# Patient Record
Sex: Female | Born: 1935 | Race: White | Hispanic: No | Smoking: Never smoker
Health system: Southern US, Community
[De-identification: ages and names within clinical notes are randomized; demographics above are authoritative.]

## PROBLEM LIST (undated history)

## (undated) HISTORY — PX: APPENDECTOMY: SHX54

## (undated) HISTORY — PX: TONSILLECTOMY: SUR1361

---

## 2014-08-28 HISTORY — PX: AORTIC VALVE REPLACEMENT: SHX41

## 2015-08-07 ENCOUNTER — Emergency Department (HOSPITAL_COMMUNITY): Payer: Medicare Other

## 2015-08-07 ENCOUNTER — Encounter (HOSPITAL_COMMUNITY): Payer: Self-pay | Admitting: Emergency Medicine

## 2015-08-07 ENCOUNTER — Inpatient Hospital Stay (HOSPITAL_COMMUNITY)
Admission: EM | Admit: 2015-08-07 | Discharge: 2015-08-11 | DRG: 378 | Disposition: A | Discharge status: Home / Self Care | Payer: Medicare Other | Attending: Internal Medicine | Admitting: Internal Medicine

## 2015-08-07 DIAGNOSIS — Z9109 Other allergy status, other than to drugs and biological substances: Secondary | ICD-10-CM

## 2015-08-07 DIAGNOSIS — I4891 Unspecified atrial fibrillation: Secondary | ICD-10-CM | POA: Diagnosis present

## 2015-08-07 DIAGNOSIS — D62 Acute posthemorrhagic anemia: Secondary | ICD-10-CM | POA: Diagnosis not present

## 2015-08-07 DIAGNOSIS — G2581 Restless legs syndrome: Secondary | ICD-10-CM | POA: Diagnosis present

## 2015-08-07 DIAGNOSIS — Z888 Allergy status to other drugs, medicaments and biological substances status: Secondary | ICD-10-CM | POA: Diagnosis not present

## 2015-08-07 DIAGNOSIS — K5521 Angiodysplasia of colon with hemorrhage: Secondary | ICD-10-CM | POA: Diagnosis present

## 2015-08-07 DIAGNOSIS — Z8249 Family history of ischemic heart disease and other diseases of the circulatory system: Secondary | ICD-10-CM | POA: Diagnosis not present

## 2015-08-07 DIAGNOSIS — T501X5A Adverse effect of loop [high-ceiling] diuretics, initial encounter: Secondary | ICD-10-CM | POA: Diagnosis present

## 2015-08-07 DIAGNOSIS — R0601 Orthopnea: Secondary | ICD-10-CM | POA: Diagnosis present

## 2015-08-07 DIAGNOSIS — Z86010 Personal history of colonic polyps: Secondary | ICD-10-CM | POA: Diagnosis not present

## 2015-08-07 DIAGNOSIS — I480 Paroxysmal atrial fibrillation: Secondary | ICD-10-CM | POA: Diagnosis present

## 2015-08-07 DIAGNOSIS — E038 Other specified hypothyroidism: Secondary | ICD-10-CM | POA: Diagnosis present

## 2015-08-07 DIAGNOSIS — R0602 Shortness of breath: Secondary | ICD-10-CM | POA: Diagnosis present

## 2015-08-07 DIAGNOSIS — E1169 Type 2 diabetes mellitus with other specified complication: Secondary | ICD-10-CM | POA: Diagnosis present

## 2015-08-07 DIAGNOSIS — E119 Type 2 diabetes mellitus without complications: Secondary | ICD-10-CM | POA: Diagnosis not present

## 2015-08-07 DIAGNOSIS — N179 Acute kidney failure, unspecified: Secondary | ICD-10-CM | POA: Diagnosis present

## 2015-08-07 DIAGNOSIS — I130 Hypertensive heart and chronic kidney disease with heart failure and stage 1 through stage 4 chronic kidney disease, or unspecified chronic kidney disease: Secondary | ICD-10-CM | POA: Diagnosis present

## 2015-08-07 DIAGNOSIS — I10 Essential (primary) hypertension: Secondary | ICD-10-CM | POA: Diagnosis not present

## 2015-08-07 DIAGNOSIS — Z7901 Long term (current) use of anticoagulants: Secondary | ICD-10-CM | POA: Diagnosis not present

## 2015-08-07 DIAGNOSIS — Z952 Presence of prosthetic heart valve: Secondary | ICD-10-CM

## 2015-08-07 DIAGNOSIS — K921 Melena: Secondary | ICD-10-CM | POA: Insufficient documentation

## 2015-08-07 DIAGNOSIS — I5032 Chronic diastolic (congestive) heart failure: Secondary | ICD-10-CM | POA: Diagnosis present

## 2015-08-07 DIAGNOSIS — D6489 Other specified anemias: Secondary | ICD-10-CM

## 2015-08-07 DIAGNOSIS — Z953 Presence of xenogenic heart valve: Secondary | ICD-10-CM

## 2015-08-07 DIAGNOSIS — K5900 Constipation, unspecified: Secondary | ICD-10-CM | POA: Diagnosis present

## 2015-08-07 DIAGNOSIS — N189 Chronic kidney disease, unspecified: Secondary | ICD-10-CM | POA: Diagnosis present

## 2015-08-07 DIAGNOSIS — K922 Gastrointestinal hemorrhage, unspecified: Secondary | ICD-10-CM | POA: Diagnosis present

## 2015-08-07 DIAGNOSIS — E785 Hyperlipidemia, unspecified: Secondary | ICD-10-CM | POA: Diagnosis present

## 2015-08-07 DIAGNOSIS — I482 Chronic atrial fibrillation: Secondary | ICD-10-CM | POA: Diagnosis not present

## 2015-08-07 DIAGNOSIS — K648 Other hemorrhoids: Secondary | ICD-10-CM | POA: Diagnosis present

## 2015-08-07 DIAGNOSIS — Z7982 Long term (current) use of aspirin: Secondary | ICD-10-CM

## 2015-08-07 DIAGNOSIS — E089 Diabetes mellitus due to underlying condition without complications: Secondary | ICD-10-CM | POA: Diagnosis not present

## 2015-08-07 DIAGNOSIS — D72829 Elevated white blood cell count, unspecified: Secondary | ICD-10-CM | POA: Diagnosis present

## 2015-08-07 DIAGNOSIS — R195 Other fecal abnormalities: Secondary | ICD-10-CM | POA: Diagnosis not present

## 2015-08-07 DIAGNOSIS — Z794 Long term (current) use of insulin: Secondary | ICD-10-CM | POA: Diagnosis not present

## 2015-08-07 DIAGNOSIS — M353 Polymyalgia rheumatica: Secondary | ICD-10-CM | POA: Diagnosis present

## 2015-08-07 DIAGNOSIS — Z6841 Body Mass Index (BMI) 40.0 and over, adult: Secondary | ICD-10-CM

## 2015-08-07 DIAGNOSIS — Z7952 Long term (current) use of systemic steroids: Secondary | ICD-10-CM | POA: Diagnosis not present

## 2015-08-07 DIAGNOSIS — E1122 Type 2 diabetes mellitus with diabetic chronic kidney disease: Secondary | ICD-10-CM | POA: Diagnosis present

## 2015-08-07 DIAGNOSIS — E039 Hypothyroidism, unspecified: Secondary | ICD-10-CM | POA: Diagnosis present

## 2015-08-07 DIAGNOSIS — N182 Chronic kidney disease, stage 2 (mild): Secondary | ICD-10-CM | POA: Insufficient documentation

## 2015-08-07 LAB — CBC WITH DIFFERENTIAL/PLATELET
BASOS ABS: 0 10*3/uL (ref 0.0–0.1)
Basophils Relative: 0 %
Eosinophils Absolute: 0 10*3/uL (ref 0.0–0.7)
Eosinophils Relative: 0 %
HEMATOCRIT: 14.7 % — AB (ref 36.0–46.0)
Hemoglobin: 4.7 g/dL — CL (ref 12.0–15.0)
LYMPHS ABS: 1.2 10*3/uL (ref 0.7–4.0)
Lymphocytes Relative: 11 %
MCH: 29.6 pg (ref 26.0–34.0)
MCHC: 32 g/dL (ref 30.0–36.0)
MCV: 92.5 fL (ref 78.0–100.0)
MONOS PCT: 4 %
Monocytes Absolute: 0.4 10*3/uL (ref 0.1–1.0)
NEUTROS ABS: 9.6 10*3/uL — AB (ref 1.7–7.7)
Neutrophils Relative %: 85 %
Platelets: 162 10*3/uL (ref 150–400)
RBC: 1.59 MIL/uL — ABNORMAL LOW (ref 3.87–5.11)
RDW: 19.3 % — AB (ref 11.5–15.5)
WBC: 11.2 10*3/uL — ABNORMAL HIGH (ref 4.0–10.5)

## 2015-08-07 LAB — COMPREHENSIVE METABOLIC PANEL
ALBUMIN: 3.3 g/dL — AB (ref 3.5–5.0)
ALT: 21 U/L (ref 14–54)
ANION GAP: 10 (ref 5–15)
AST: 28 U/L (ref 15–41)
Alkaline Phosphatase: 39 U/L (ref 38–126)
BILIRUBIN TOTAL: 0.8 mg/dL (ref 0.3–1.2)
BUN: 52 mg/dL — ABNORMAL HIGH (ref 6–20)
CO2: 22 mmol/L (ref 22–32)
Calcium: 8.6 mg/dL — ABNORMAL LOW (ref 8.9–10.3)
Chloride: 105 mmol/L (ref 101–111)
Creatinine, Ser: 1.7 mg/dL — ABNORMAL HIGH (ref 0.44–1.00)
GFR calc Af Amer: 31 mL/min — ABNORMAL LOW (ref >60)
GFR, EST NON AFRICAN AMERICAN: 27 mL/min — AB (ref >60)
Glucose, Bld: 185 mg/dL — ABNORMAL HIGH (ref 65–99)
POTASSIUM: 4.5 mmol/L (ref 3.5–5.1)
Sodium: 137 mmol/L (ref 135–145)
TOTAL PROTEIN: 5.4 g/dL — AB (ref 6.5–8.1)

## 2015-08-07 LAB — BRAIN NATRIURETIC PEPTIDE: B NATRIURETIC PEPTIDE 5: 305.1 pg/mL — AB (ref 0.0–100.0)

## 2015-08-07 LAB — PREPARE RBC (CROSSMATCH)

## 2015-08-07 LAB — I-STAT TROPONIN, ED: TROPONIN I, POC: 0.05 ng/mL (ref 0.00–0.08)

## 2015-08-07 LAB — POC OCCULT BLOOD, ED: Fecal Occult Bld: POSITIVE — AB

## 2015-08-07 LAB — CBG MONITORING, ED: GLUCOSE-CAPILLARY: 152 mg/dL — AB (ref 65–99)

## 2015-08-07 MED ORDER — ONDANSETRON HCL 4 MG/2ML IJ SOLN: 4.0000 mg | Freq: Four times a day (QID) | INTRAMUSCULAR | Status: DC | PRN

## 2015-08-07 MED ORDER — ROPINIROLE HCL 1 MG PO TABS
1.0000 mg | ORAL_TABLET | Freq: Every day | ORAL | Status: DC
  Administered 2015-08-07 – 2015-08-11 (×5): 1 mg via ORAL
  Filled 2015-08-07 (×5): qty 1

## 2015-08-07 MED ORDER — PREDNISONE 5 MG PO TABS
5.0000 mg | ORAL_TABLET | Freq: Every day | ORAL | Status: DC
  Administered 2015-08-08 – 2015-08-11 (×4): 5 mg via ORAL
  Filled 2015-08-07 (×4): qty 1

## 2015-08-07 MED ORDER — CALCIUM CARBONATE-VITAMIN D 500-200 MG-UNIT PO TABS
1.0000 | ORAL_TABLET | Freq: Every day | ORAL | Status: DC
  Administered 2015-08-07 – 2015-08-11 (×4): 1 via ORAL
  Filled 2015-08-07 (×6): qty 1

## 2015-08-07 MED ORDER — FLUTICASONE PROPIONATE 50 MCG/ACT NA SUSP
1.0000 | Freq: Every day | NASAL | Status: DC
  Administered 2015-08-09 – 2015-08-11 (×3): 1 via NASAL
  Filled 2015-08-07: qty 16

## 2015-08-07 MED ORDER — SODIUM CHLORIDE 0.9 % IV SOLN
Freq: Once | INTRAVENOUS | Status: AC
  Administered 2015-08-07: 18:00:00 via INTRAVENOUS

## 2015-08-07 MED ORDER — ACETAMINOPHEN 325 MG PO TABS: 650.0000 mg | ORAL_TABLET | Freq: Four times a day (QID) | ORAL | Status: DC | PRN

## 2015-08-07 MED ORDER — ONDANSETRON HCL 4 MG PO TABS: 4.0000 mg | ORAL_TABLET | Freq: Four times a day (QID) | ORAL | Status: DC | PRN

## 2015-08-07 MED ORDER — AMLODIPINE BESYLATE 2.5 MG PO TABS
2.5000 mg | ORAL_TABLET | Freq: Every day | ORAL | Status: DC
  Administered 2015-08-08 – 2015-08-11 (×4): 2.5 mg via ORAL
  Filled 2015-08-07 (×4): qty 1

## 2015-08-07 MED ORDER — SODIUM CHLORIDE 0.9 % IJ SOLN
3.0000 mL | Freq: Two times a day (BID) | INTRAMUSCULAR | Status: DC
  Administered 2015-08-10 – 2015-08-11 (×2): 3 mL via INTRAVENOUS

## 2015-08-07 MED ORDER — ATORVASTATIN CALCIUM 80 MG PO TABS
80.0000 mg | ORAL_TABLET | Freq: Every day | ORAL | Status: DC
Start: 2015-08-08 — End: 2015-08-11
  Administered 2015-08-08 – 2015-08-11 (×4): 80 mg via ORAL
  Filled 2015-08-07 (×4): qty 1

## 2015-08-07 MED ORDER — VITAMIN D3 25 MCG (1000 UNIT) PO TABS
2000.0000 [IU] | ORAL_TABLET | Freq: Every day | ORAL | Status: DC
  Administered 2015-08-07 – 2015-08-11 (×5): 2000 [IU] via ORAL
  Filled 2015-08-07 (×5): qty 2

## 2015-08-07 MED ORDER — GLIMEPIRIDE 1 MG PO TABS
1.0000 mg | ORAL_TABLET | Freq: Every day | ORAL | Status: DC
  Administered 2015-08-08 – 2015-08-11 (×2): 1 mg via ORAL
  Filled 2015-08-07 (×4): qty 1

## 2015-08-07 MED ORDER — LEVOTHYROXINE SODIUM 50 MCG PO TABS
50.0000 ug | ORAL_TABLET | Freq: Every day | ORAL | Status: DC
  Administered 2015-08-08 – 2015-08-11 (×4): 50 ug via ORAL
  Filled 2015-08-07 (×4): qty 1

## 2015-08-07 MED ORDER — ACETAMINOPHEN 650 MG RE SUPP: 650.0000 mg | Freq: Four times a day (QID) | RECTAL | Status: DC | PRN

## 2015-08-07 MED ORDER — AMIODARONE HCL 200 MG PO TABS
200.0000 mg | ORAL_TABLET | Freq: Every day | ORAL | Status: DC
  Administered 2015-08-08 – 2015-08-11 (×4): 200 mg via ORAL
  Filled 2015-08-07 (×4): qty 1

## 2015-08-07 MED ORDER — SODIUM CHLORIDE 0.9 % IV SOLN
INTRAVENOUS | Status: DC
  Administered 2015-08-07 – 2015-08-10 (×5): via INTRAVENOUS

## 2015-08-07 MED ORDER — OXYBUTYNIN CHLORIDE ER 15 MG PO TB24
15.0000 mg | ORAL_TABLET | Freq: Every day | ORAL | Status: DC
  Administered 2015-08-07 – 2015-08-11 (×5): 15 mg via ORAL
  Filled 2015-08-07 (×5): qty 1

## 2015-08-07 MED ORDER — SODIUM CHLORIDE 0.9 % IV SOLN
Freq: Once | INTRAVENOUS | Status: AC
  Administered 2015-08-07: 21:00:00 via INTRAVENOUS

## 2015-08-07 MED ORDER — OMEGA-3-ACID ETHYL ESTERS 1 G PO CAPS
1.0000 | ORAL_CAPSULE | Freq: Every day | ORAL | Status: DC
  Administered 2015-08-08 – 2015-08-11 (×4): 1 g via ORAL
  Filled 2015-08-07 (×4): qty 1

## 2015-08-07 NOTE — ED Notes (Signed)
Daughter says this morning pt woke up acting more weak than normal. Later in the day pt complained of difficulty breathing. On arrival to the ER patient began to vomit. Hx of valve replacement in April. Patient states earlier this week has been urinating more frequently. Diagnosed with Bronchitis last week and recently finished antibiotics, still coughing with rhonchi to upper and lower right lobe. Presents pale, tachypnic, deep even respirations, nauseous.

## 2015-08-07 NOTE — ED Provider Notes (Addendum)
CSN: [id number]     Arrival date & time 08/07/15  1555 History   First MD Initiated Contact with Patient 08/07/15 1612     Chief Complaint  Patient presents with  . Shortness of Breath  . Emesis     (Consider location/radiation/quality/duration/timing/severity/associated sxs/prior Treatment) HPI Comments: Patient is an 81 year old female with a history of atrial fibrillation, aortic stenosis status post valve replacement approximately one year ago, diabetes who presents today with worsening shortness of breath over the last 3 days one episode of vomiting when she arrived in the emergency room, nonproductive cough and leg swelling. Patient does not check her weight regularly but without her doctor 5 days ago and weighed 205 at that time. Approximate 2 weeks ago she had URI symptoms most suggestive of bronchitis. When she saw her doctor 5 days ago they gave her a shot of steroids however her symptoms have only worsened. She denies fever, diarrhea, abdominal pain. She's currently taking furosemide every other day and that has not changed. She denies any new diet changes with increased salt. Other than the steroid injection she has had no medication changes.  Patient is a 81 y.o. female presenting with shortness of breath and vomiting. The history is provided by the patient.  Shortness of Breath Severity:  Moderate Onset quality:  Gradual Duration:  3 days Timing:  Constant Progression:  Worsening Chronicity:  New Relieved by:  Rest Worsened by:  Activity (lying down) Associated symptoms: cough and vomiting   Associated symptoms: no abdominal pain, no chest pain, no fever, no headaches, no sputum production and no wheezing   Associated symptoms comment:  Swelling in  The legs Risk factors comment:  History of aortic valve replacement, afib, dm Emesis Associated symptoms: no abdominal pain and no headaches     Past Medical History  Diagnosis Date  . Restless leg syndrome   . Diabetes  mellitus without complication (HCC)   . Thyroid disease   . Aortic stenosis   . Atrial fibrillation [facility](HCC)    Past Surgical History  Procedure Laterality Date  . Aortic valve replacement    . Appendectomy    . Tonsillectomy     History reviewed. No pertinent family history. Social History  Substance Use Topics  . Smoking status: None  . Smokeless tobacco: None  . Alcohol Use: None   OB History    No data available     Review of Systems  Constitutional: Negative for fever.  Respiratory: Positive for cough and shortness of breath. Negative for sputum production and wheezing.   Cardiovascular: Negative for chest pain.  Gastrointestinal: Positive for vomiting. Negative for abdominal pain.  Neurological: Negative for headaches.  All other systems reviewed and are negative.     Allergies  Clonidine derivatives; Statins; and Tape  Home Medications   Prior to Admission medications   Not on File   BP 117/38 mmHg  Pulse 61  Temp(Src) 97.9 F (36.6 C) (Oral)  SpO2 100% Physical Exam  Constitutional: She is oriented to person, place, and time. She appears well-developed and well-nourished. No distress.  HENT:  Head: Normocephalic and atraumatic.  Mouth/Throat: Oropharynx is clear and moist.  Eyes: Conjunctivae and EOM are normal. Pupils are equal, round, and reactive to light.  Neck: Normal range of motion. Neck supple.  Cardiovascular: Normal rate, regular rhythm and intact distal pulses.   No murmur heard. Pulmonary/Chest: Effort normal and breath sounds normal. Tachypnea noted. No respiratory distress. She has no wheezes. She has no  rales.  Abdominal: Soft. She exhibits distension. There is no tenderness. There is no rebound and no guarding.  Musculoskeletal: Normal range of motion. She exhibits edema. She exhibits no tenderness.  Bilateral 2+ pitting edema to the midshin  Neurological: She is alert and oriented to person, place, and time.  Skin: Skin is warm and  dry. No rash noted. No erythema.  Psychiatric: She has a normal mood and affect. Her behavior is normal.  Nursing note and vitals reviewed.   ED Course  Procedures (including critical care time) Labs Review Labs Reviewed  CBC WITH DIFFERENTIAL/PLATELET - Abnormal; Notable for the following:    WBC 11.2 (*)    RBC 1.59 (*)    Hemoglobin 4.7 (*)    HCT 14.7 (*)    RDW 19.3 (*)    All other components within normal limits  COMPREHENSIVE METABOLIC PANEL - Abnormal; Notable for the following:    Glucose, Bld 185 (*)    BUN 52 (*)    Creatinine, Ser 1.70 (*)    Calcium 8.6 (*)    Total Protein 5.4 (*)    Albumin 3.3 (*)    GFR calc non Af Amer 27 (*)    GFR calc Af Amer 31 (*)    All other components within normal limits  BRAIN NATRIURETIC PEPTIDE - Abnormal; Notable for the following:    B Natriuretic Peptide 305.1 (*)    All other components within normal limits  CBG MONITORING, ED - Abnormal; Notable for the following:    Glucose-Capillary 152 (*)    All other components within normal limits  POC OCCULT BLOOD, ED - Abnormal; Notable for the following:    Fecal Occult Bld POSITIVE (*)    All other components within normal limits  OCCULT BLOOD X 1 CARD TO LAB, STOOL  I-STAT TROPOININ, ED  TYPE AND SCREEN  PREPARE RBC (CROSSMATCH)    Imaging Review Dg Chest 2 View  08/07/2015  CLINICAL DATA:  Shortness of Breath EXAM: CHEST  2 VIEW COMPARISON:  None. FINDINGS: Cardiomegaly is noted. No acute infiltrate or pulmonary edema. Mild basilar atelectasis. There is aortic valve prosthesis. Osteopenia and degenerative changes thoracic spine IMPRESSION: No active cardiopulmonary disease. Electronically Signed   By: Natasha Mead M.D.   On: 08/07/2015 17:37   I have personally reviewed and evaluated these images and lab results as part of my medical decision-making.   EKG Interpretation   Date/Time:  Saturday August 07 2015 16:09:55 EST Ventricular Rate:  63 PR Interval:    QRS  Duration: 95 QT Interval:  493 QTC Calculation: 505 R Axis:   -39 Text Interpretation:  Sinus rhythm Left axis deviation Abnormal T,  consider ischemia, lateral leads Prolonged QT interval No previous tracing  Confirmed by Anitra Lauth  MD, Alphonzo Lemmings ([id number]) on 08/07/2015 4:13:56 PM      MDM   Final diagnoses:  Gastrointestinal hemorrhage with melena  Anemia due to other cause    Patient is an 81 year old female with multiple medical problems presenting today with shortness of breath. She has had a nonproductive cough and some symptoms today do not seem to be infection related. She is afebrile, nonproductive cough and she has no infectious symptoms. She did receive an injection of steroids for possible bronchitis last week however feel more likely today is cardiac related such as an STEMI or CHF. She has had new lower leg swelling and abdominal fullness. Also she is complaining of orthopnea and exertional dyspnea. She is still taking  her diuretic and her normal medications without changing. She denies chest pain but her EKG shows T-wave inversion laterally without an old to compare.  Low suspicion for aortic dissection or PE.  Also possible pt has anemia as she is very pale on exam.  CBC, CMP, BNP, troponin, chest x-ray pending  5:26 PM Labs are consistent with a CBC with a hemoglobin of 4.7, CMP with mild renal insufficiency with a creatinine of 1.7 but otherwise within normal limits. No prior leads to compare. Troponin is within normal limits. Chest x-ray without acute findings.  Patient consented for blood and 2 units ordered.  Hemoccult pending  5:49 PM Hemoccult positive.  Will consult GI.  Admitted to medicine for transfusion and GI bleed.  CRITICAL CARE Performed by: Gwyneth Sprout Total critical care time: 30 minutes Critical care time was exclusive of separately billable procedures and treating other patients. Critical care was necessary to treat or prevent imminent or  life-threatening deterioration. Critical care was time spent personally by me on the following activities: development of treatment plan with patient and/or surrogate as well as nursing, discussions with consultants, evaluation of patient's response to treatment, examination of patient, obtaining history from patient or surrogate, ordering and performing treatments and interventions, ordering and review of laboratory studies, ordering and review of radiographic studies, pulse oximetry and re-evaluation of patient's condition.   Gwyneth Sprout, MD 08/07/15 1750  Gwyneth Sprout, MD 08/07/15 [id number]

## 2015-08-07 NOTE — ED Notes (Signed)
JESSICA RN CHARGE FOR 4TH FLOOR REQUESTED FOR THIS PT TO GO UP AT 1930. CHARGE PATTI RN AWARE.

## 2015-08-07 NOTE — H&P (Signed)
[facility] History and Physical  Tonya Spence EXB:[id number] DOB: 1931-01-11 DOA: 08/07/2015  Referring physician: ER physician: Dr. Gwyneth Sprout  PCP: [name], MD  Chief Complaint: shortness of breath   HPI:  81 year old female with a history of atrial fibrillation, aortic stenosis status post valve replacement approximately one year ago, diabetes (on glimepiride) who presented to [facility] ED for worsening weakness, shortness of breath over past few days prior to this admission. Shortness of breath was present at rest and with exertion. She has chronic lower extremity swelling for which takes lasix every other day. No fevers. She does have cough but not productive. No chest pain. No abdominal pain, nausea but had 1 episode of non bloody vomiting. No lightheadedness or loss of consciousness. No reports of blood in stool.  In ED, pt was hemodynamically stable. Blood work showed WBC count 11.2, hemoglobin 4.7, Creatinine 1.7. CXR showed no acute cardiopulmonary findings. FOBT positive in ED. Transfusion started in ED. GI consulted and will see the pt in consultation.   Assessment & Plan    Principal Problem:   Acute lower GI bleeding - Triggered by [facility], aspirin - Unclear etiology of bleed - FOBT positive  - AC and aspirin on hold - GI consulted, appreciate their input  - Transfuse 3 units PRBC - Check post transfusion hemoglobin - Monitor on telemetry   Active Problems:   Restless leg syndrome - Continue requip    Leukocytosis - She is on low dose steroids - No evidence of acute infectious process    Essential hypertension - Continue Norvasc - Hold losartan and lasix due to ARF    Acute renal failure (ARF) (HCC) - Due to lasix and losartan which were placed on hold - There is probably component of CKD but no previous Cr on file for comparison    Dyslipidemia associated with type 2 diabetes mellitus (HCC) - Resume omega 3 and Lipitor    Other specified  hypothyroidism - Continue synthroid    Diabetes mellitus without complication, without long-term current use of insulin (HCC) - Check A1c, no other A1c on file - Resume glimepiride     Atrial fibrillation (HCC) / Aortic stenosis s/p AV replacement  - CHADS vasc score 3 (age, gender, diabtees, hypertension). Not known if she has CHF so score may be even higher if there is h/o CHF - AC not indicated at this time due to risk of bleed - Aspirin on hold - Rate controlled without beta blockers  DVT prophylaxis:  - SCD's bilaterally due to risk of bleed   Radiological Exams on Admission: Dg Chest 2 View 08/07/2015   No active cardiopulmonary disease. Electronically Signed   By: Natasha Mead M.D.   On: 08/07/2015 17:37   Code Status: Full Family Communication: Plan of care discussed with the patient  Disposition Plan: Admit for further evaluation, telemetry admission   Manson Passey, MD  [facility] Pager [phone]  Time spent in minutes: 75 minutes  Review of Systems:  Constitutional: Negative for fever, chills and malaise/fatigue. Negative for diaphoresis.  HENT: Negative for hearing loss, ear pain, nosebleeds, congestion, sore throat, neck pain, tinnitus and ear discharge.   Eyes: Negative for blurred vision, double vision, photophobia, pain, discharge and redness.  Respiratory: Negative for cough, hemoptysis, sputum production, shortness of breath, wheezing and stridor.   Cardiovascular: Negative for chest pain, palpitations, orthopnea, claudication and leg swelling.  Gastrointestinal: Negative for nausea, vomiting and abdominal pain. Negative for heartburn, constipation, blood in stool and  melena.  Genitourinary: Negative for dysuria, urgency, frequency, hematuria and flank pain.  Musculoskeletal: Negative for myalgias, back pain, joint pain and falls.  Skin: Negative for itching and rash.  Neurological: Negative for dizziness and weakness. Negative for tingling, tremors,  sensory change, speech change, focal weakness, loss of consciousness and headaches.  Endo/Heme/Allergies: Negative for environmental allergies and polydipsia. Does not bruise/bleed easily.  Psychiatric/Behavioral: Negative for suicidal ideas. The patient is not nervous/anxious.      History reviewed. No pertinent past medical history. Past Surgical History  Procedure Laterality Date  . Aortic valve replacement    . Appendectomy    . Tonsillectomy     Social History:  has no tobacco, alcohol, and drug history on file.  Allergies  Allergen Reactions  . Clonidine Derivatives Swelling  . Statins Swelling  . Tape Rash    Family History: Hypertension in mother    Prior to Admission medications   Medication Sig Start Date End Date Taking? Authorizing Provider  amiodarone (PACERONE) 200 MG tablet Take 200 mg by mouth daily.   Yes Historical Provider, MD  amLODipine (NORVASC) 2.5 MG tablet Take 2.5 mg by mouth daily.   Yes Historical Provider, MD  aspirin EC 81 MG tablet Take 81 mg by mouth daily.   Yes Historical Provider, MD  atorvastatin (LIPITOR) 80 MG tablet Take 80 mg by mouth daily. 06/10/15  Yes Historical Provider, MD  Calcium Carb-Cholecalciferol 600-500 MG-UNIT CAPS Take 1 capsule by mouth daily.   Yes Historical Provider, MD  Cholecalciferol (VITAMIN D) 2000 UNITS CAPS Take 1 capsule by mouth daily.   Yes Historical Provider, MD  fluticasone (FLONASE) 50 MCG/ACT nasal spray Place 1-2 sprays into both nostrils daily. 05/29/15  Yes Historical Provider, MD  furosemide (LASIX) 40 MG tablet Take 40 mg by mouth every other day. For fluid 06/10/15  Yes Historical Provider, MD  glimepiride (AMARYL) 1 MG tablet Take 1 mg by mouth daily with breakfast.   Yes Historical Provider, MD  levothyroxine (SYNTHROID, LEVOTHROID) 50 MCG tablet Take 50 mcg by mouth daily. 07/14/15  Yes Historical Provider, MD  losartan (COZAAR) 100 MG tablet Take 100 mg by mouth daily.   Yes Historical Provider, MD   Omega-3 Fatty Acids (OMEGA-3 FISH OIL PO) Take 1 capsule by mouth daily.   Yes Historical Provider, MD  oxybutynin (DITROPAN XL) 15 MG 24 hr tablet Take 15 mg by mouth daily. 08/02/15  Yes Historical Provider, MD  pantoprazole (PROTONIX) 40 MG tablet Take 40 mg by mouth daily. 06/10/15  Yes Historical Provider, MD  predniSONE (DELTASONE) 5 MG tablet Take 5 mg by mouth daily with breakfast.   Yes Historical Provider, MD  rOPINIRole (REQUIP) 1 MG tablet Take 1 mg by mouth daily. 08/02/15  Yes Historical Provider, MD  azithromycin (ZITHROMAX) 250 MG tablet Take 250 mg by mouth daily. Azithromycin pack (5 day supply) 07/27/15   Historical Provider, MD  methylPREDNISolone (MEDROL DOSEPAK) 4 MG TBPK tablet  07/27/15   Historical Provider, MD   Physical Exam: Filed Vitals:   08/07/15 1643 08/07/15 1644 08/07/15 1701 08/07/15 1731  BP: 103/22  102/24 115/83  Pulse: 61  60 62  Temp:      TempSrc:      Resp: Height:   (1.499 m)    Weight:  92.534 kg (204 lb)    SpO2: 98%  99% 99%    Physical Exam  Constitutional: Appears well-developed and well-nourished. No distress.  HENT: Normocephalic.  No tonsillar erythema or exudates Eyes: Conjunctivae are pale. No scleral icterus.  Neck: Normal ROM. Neck supple. No JVD. No tracheal deviation. No thyromegaly.  CVS: RRR, S1/S2 appreciated, murmur appreciated  Pulmonary: Effort and breath sounds normal, no stridor, rhonchi, wheezes, rales.  Abdominal: Soft. BS +,  no distension, tenderness, rebound or guarding.  Musculoskeletal: Normal range of motion. Edema (+), no tenderness  Lymphadenopathy: No lymphadenopathy noted, cervical, inguinal. Neuro: Alert. Normal reflexes, muscle tone coordination. No focal neurologic deficits. Skin: Skin is warm and dry. No rash noted.  No erythema. No pallor.  Psychiatric: Normal mood and affect. Behavior, judgment, thought content normal.   Labs on Admission:  Basic Metabolic Panel:  Recent Labs Lab  08/07/15 1636  NA 137  K 4.5  CL 105  CO2 22  GLUCOSE 185*  BUN 52*  CREATININE 1.70*  CALCIUM 8.6*   Liver Function Tests:  Recent Labs Lab 08/07/15 1636  AST 28  ALT 21  ALKPHOS 39  BILITOT 0.8  PROT 5.4*  ALBUMIN 3.3*   No results for input(s): LIPASE, AMYLASE in the last 168 hours. No results for input(s): AMMONIA in the last 168 hours. CBC:  Recent Labs Lab 08/07/15 1636  WBC 11.2*  NEUTROABS PENDING  HGB 4.7*  HCT 14.7*  MCV 92.5  PLT 162   Cardiac Enzymes: No results for input(s): CKTOTAL, CKMB, CKMBINDEX, TROPONINI in the last 168 hours. BNP: Invalid input(s): POCBNP CBG:  Recent Labs Lab 08/07/15 1610  GLUCAP 152*    If 7PM-7AM, please contact night-coverage [email or web address] Password [email or web address] 08/07/2015, 5:57 PM

## 2015-08-08 ENCOUNTER — Encounter (HOSPITAL_COMMUNITY): Payer: Self-pay | Admitting: Physician Assistant

## 2015-08-08 DIAGNOSIS — D62 Acute posthemorrhagic anemia: Secondary | ICD-10-CM

## 2015-08-08 DIAGNOSIS — R195 Other fecal abnormalities: Secondary | ICD-10-CM

## 2015-08-08 DIAGNOSIS — Z7901 Long term (current) use of anticoagulants: Secondary | ICD-10-CM

## 2015-08-08 LAB — CBC
HCT: 24.5 % — ABNORMAL LOW (ref 36.0–46.0)
HCT: 27.5 % — ABNORMAL LOW (ref 36.0–46.0)
HEMOGLOBIN: 8.8 g/dL — AB (ref 12.0–15.0)
Hemoglobin: 8 g/dL — ABNORMAL LOW (ref 12.0–15.0)
MCH: 28.3 pg (ref 26.0–34.0)
MCH: 27.7 pg (ref 26.0–34.0)
MCHC: 32.7 g/dL (ref 30.0–36.0)
MCHC: 32 g/dL (ref 30.0–36.0)
MCV: 86.6 fL (ref 78.0–100.0)
MCV: 86.5 fL (ref 78.0–100.0)
PLATELETS: 145 10*3/uL — AB (ref 150–400)
PLATELETS: 164 10*3/uL (ref 150–400)
RBC: 2.83 MIL/uL — ABNORMAL LOW (ref 3.87–5.11)
RBC: 3.18 MIL/uL — ABNORMAL LOW (ref 3.87–5.11)
RDW: 19.6 % — AB (ref 11.5–15.5)
RDW: 20.7 % — AB (ref 11.5–15.5)
WBC: 10.6 10*3/uL — AB (ref 4.0–10.5)
WBC: 13.3 10*3/uL — AB (ref 4.0–10.5)

## 2015-08-08 LAB — ABO/RH: ABO/RH(D): O POS

## 2015-08-08 LAB — GLUCOSE, CAPILLARY: Glucose-Capillary: 93 mg/dL (ref 65–99)

## 2015-08-08 MED ORDER — BISACODYL 5 MG PO TBEC
5.0000 mg | DELAYED_RELEASE_TABLET | Freq: Four times a day (QID) | ORAL | Status: AC
  Administered 2015-08-08 (×2): 5 mg via ORAL
  Filled 2015-08-08 (×2): qty 1

## 2015-08-08 MED ORDER — PEG-KCL-NACL-NASULF-NA ASC-C 100 G PO SOLR
0.5000 | Freq: Once | ORAL | Status: AC
  Administered 2015-08-08: 100 g via ORAL
  Filled 2015-08-08: qty 1

## 2015-08-08 MED ORDER — CETYLPYRIDINIUM CHLORIDE 0.05 % MT LIQD
7.0000 mL | Freq: Two times a day (BID) | OROMUCOSAL | Status: DC
  Administered 2015-08-08 – 2015-08-11 (×7): 7 mL via OROMUCOSAL

## 2015-08-08 MED ORDER — METOCLOPRAMIDE HCL 5 MG/ML IJ SOLN: 10.0000 mg | Freq: Four times a day (QID) | INTRAMUSCULAR | Status: AC | PRN

## 2015-08-08 MED ORDER — PEG-KCL-NACL-NASULF-NA ASC-C 100 G PO SOLR
1.0000 | Freq: Once | ORAL | Status: DC
Start: 2015-08-08 — End: 2015-08-08

## 2015-08-08 MED ORDER — PEG-KCL-NACL-NASULF-NA ASC-C 100 G PO SOLR
0.5000 | Freq: Once | ORAL | Status: AC
  Administered 2015-08-09: 100 g via ORAL

## 2015-08-08 MED ORDER — PANTOPRAZOLE SODIUM 40 MG PO TBEC
40.0000 mg | DELAYED_RELEASE_TABLET | Freq: Two times a day (BID) | ORAL | Status: DC
  Administered 2015-08-08 – 2015-08-11 (×7): 40 mg via ORAL
  Filled 2015-08-08 (×8): qty 1

## 2015-08-08 MED ORDER — PANTOPRAZOLE SODIUM 40 MG PO TBEC: 40.0000 mg | DELAYED_RELEASE_TABLET | Freq: Every day | ORAL | Status: DC

## 2015-08-08 NOTE — Progress Notes (Addendum)
Patient ID: Tonya Spence, female   DOB: [birth year], 81 y.o.   MRN: [id number] [facility] PROGRESS NOTE  Tonya Spence OEV:[id number] DOB: November 10, 1930 DOA: 08/07/2015 PCP: Adline Mango, MD  Brief narrative:    81 year old female with a history of atrial fibrillation, aortic stenosis status post valve replacement approximately one year ago (on [place]), diabetes (on glimepiride) who presented to [facility] ED for worsening weakness, shortness of breath over past few days prior to this admission. In ED, pt was hemodynamically stable. Blood work showed WBC count 11.2, hemoglobin 4.7, Creatinine 1.7. CXR showed no acute cardiopulmonary findings. FOBT positive in ED. Transfusion started in ED. GI plans EGD and colonoscopy.   Assessment/Plan:    Principal Problem:  Acute lower GI bleeding - Triggered by [facility], aspirin - FOBT positive in ED - Savaysa and aspirin on hold - GI plans EGD and colonoscopy tomorrow am - She has received 3 units PRBC transfusion since admission with post transfusion hgb 8.0  Active Problems:  Restless leg syndrome - Continue requip   Leukocytosis - On low dose steroids - No evidence of acute infectious process   Essential hypertension - Continue Norvasc - Losartan and lasix held due to ARF - BP 117/33   Acute renal failure (ARF) (HCC) - Due to lasix and losartan which were held on admission    Dyslipidemia associated with type 2 diabetes mellitus (HCC) - Continue omega 3 and Lipitor   Other specified hypothyroidism - Continue synthroid   Diabetes mellitus without complication, without long-term current use of insulin (HCC) - A1c pending  - Continue glimepiride    Atrial fibrillation (HCC) / Aortic stenosis s/p AV replacement  - CHADS vasc score 3 (age, gender, diabtees, hypertension). Not known if she has CHF so score may be even higher if there is h/o CHF - AC not indicated at this time due to bleeding  - Aspirin on hold, Svaysa  on hold - Rate controlled without beta blockers  DVT Prophylaxis  - SCD's bilaterally due to risk of bleed   Code Status: Full.  Family Communication:  plan of care discussed with the patient Disposition Plan: Home likely by 08/10/2015.  IV access:  Peripheral IV  Procedures and diagnostic studies:    Dg Chest 2 View 08/07/2015 No active cardiopulmonary disease. Electronically Signed By: Lahoma Crocker M.D. On: 08/07/2015 17:37  Medical Consultants:  GI, Dr. Lucio Edward  Other Consultants:  None   IAnti-Infectives:   None    Leisa Lenz, MD  [facility] Pager [phone]  Time spent in minutes: 15 minutes  If 7PM-7AM, please contact night-coverage [email or web address] Password [facility] 08/08/2015, 12:50 PM   LOS: 1 day    HPI/Subjective: No acute overnight events. Patient reports no vomiting.   Objective: Filed Vitals:   08/07/15 2350 08/08/15 0210 08/08/15 0245 08/08/15 0455  BP: 132/35 119/24 122/38 117/33  Pulse: 53 54 51 52  Temp: 98.7 F (37.1 C) 98.6 F (37 C) 98.7 F (37.1 C) 98.7 F (37.1 C)  TempSrc: Oral Oral Oral Oral  Resp: _0 Height:      Weight:    90.5 kg (199 lb 8.3 oz)  SpO2: 100% 100% 100% 98%    Intake/Output Summary (Last 24 hours) at 08/08/15 1250 Last data filed at 08/08/15 0455  Gross per 24 hour  Intake    920 ml  Output   1325 ml  Net   -405 ml    Exam:   General:  Pt is  alert, follows commands appropriately, not in acute distress  Cardiovascular: Regular rate and rhythm, S1/S2 appreciated   Respiratory: Clear to auscultation bilaterally, no wheezing, no crackles, no rhonchi  Abdomen: Soft, non tender, non distended, bowel sounds present  Extremities: No cyanosis, pulses DP and PT palpable bilaterally  Neuro: Grossly nonfocal  Data Reviewed: Basic Metabolic Panel:  Recent Labs Lab 08/07/15 1636  NA 137  K 4.5  CL 105  CO2 22  GLUCOSE 185*  BUN 52*  CREATININE 1.70*  CALCIUM 8.6*   Liver  Function Tests:  Recent Labs Lab 08/07/15 1636  AST 28  ALT 21  ALKPHOS 39  BILITOT 0.8  PROT 5.4*  ALBUMIN 3.3*   No results for input(s): LIPASE, AMYLASE in the last 168 hours. No results for input(s): AMMONIA in the last 168 hours. CBC:  Recent Labs Lab 08/07/15 1636 08/08/15 0520  WBC 11.2* 10.6*  NEUTROABS 9.6*  --   HGB 4.7* 8.0*  HCT 14.7* 24.5*  MCV 92.5 86.6  PLT 162 145*   Cardiac Enzymes: No results for input(s): CKTOTAL, CKMB, CKMBINDEX, TROPONINI in the last 168 hours. BNP: Invalid input(s): POCBNP CBG:  Recent Labs Lab 08/07/15 1610 08/08/15 0743  GLUCAP 152* 93    No results found for this or any previous visit (from the past 240 hour(s)).   Scheduled Meds: . amiodarone  200 mg Oral Daily  . amLODipine  2.5 mg Oral Daily  . antiseptic oral rinse  7 mL Mouth Rinse BID  . atorvastatin  80 mg Oral Daily  . bisacodyl  5 mg Oral Q6H  . calcium-vitamin D  1 tablet Oral Daily  . cholecalciferol  2,000 Units Oral Daily  . fluticasone  1-2 spray Each Nare Daily  . glimepiride  1 mg Oral Q breakfast  . levothyroxine  50 mcg Oral QAC breakfast  . omega-3 acid ethyl esters  1 capsule Oral Daily  . oxybutynin  15 mg Oral Daily  . pantoprazole  40 mg Oral BID  . peg 3350 powder  0.5 kit Oral Once   And  . [START ON 08/09/2015] peg 3350 powder  0.5 kit Oral Once  . predniSONE  5 mg Oral Q breakfast  . rOPINIRole  1 mg Oral Daily  . sodium chloride  3 mL Intravenous Q12H   Continuous Infusions: . sodium chloride 75 mL/hr at 08/07/15 1945

## 2015-08-08 NOTE — Progress Notes (Signed)
Utilization review completed.  

## 2015-08-08 NOTE — Consult Note (Signed)
[facility] Consult: 8:14 AM 08/08/2015  LOS: 1 day    Referring Provider: Dr Elisabeth Pigeon  Primary Care Physician:  [name], MD in [facility] Primary Gastroenterologist:  unassigned    Reason for Consultation:  GIB and normocytic anemia.    HPI: Tonya Spence is a 81 y.o. female.  Obese (BMI 40). Chronic AC with Savaysa for A fib. s/p bovine AVR.  Hypothyroidism.  Type 2 DM.  Restless Leg.  PMR for which she is on chronic Prednisone.  Hx anemia and "losing blood", EGD and colonoscopy for this about 8 yrs ago in [place].  Placed on PPI (Pantoprazole currently) after that and this resolved all heartburn problems.  Also had colon polyp and was told she would need colonoscopy in 10 years. Takes oral B12 but never Rx'd with iron. Linton Ham from [place] to [place] in 03/2015.  She moved with and lives with Homero Fellers an Josefina Do, best friends for last 50 years. Phone [phone]. The couple have always helped pt out with her healthcare issues.   2 weeks ago placed on medrol dose back and Levaquin for purulent cough and dx bronchitis. Finished abx ~ 3 days PTA.    One week ago she started having black stool.  A few days later her appetite decreased.  Then she had DOE and fatigue.  + bounding hear rate, no chest pressure or pain.  Yesterday had mental fog and trouble concentrating.  Legs were swollen.  So she presented to ED.  Had gagging in ED but otherwise never nauseated.  No absd pain.  No blood per rectum .  No previous transfusions.  Prn tylenol for pain, no NSAIDS.  + 81 ASA.  FOBT+ Hgb 4.7, MCV 92.  hgb after PRBC x 3 is 8.  Platelets 162.  WBCs 11.2 BUN.creat 52/1.7 Unremarkable CXR.     Past Medical history. Per HPI.   Past Surgical History  Procedure Laterality Date  . Aortic valve replacement    .  Appendectomy    . Tonsillectomy      Prior to Admission medications   Medication Sig Start Date End Date Taking? Authorizing Provider  amiodarone (PACERONE) 200 MG tablet Take 200 mg by mouth daily.   Yes Historical Provider, MD  amLODipine (NORVASC) 2.5 MG tablet Take 2.5 mg by mouth daily.   Yes Historical Provider, MD  aspirin EC 81 MG tablet Take 81 mg by mouth daily.   Yes Historical Provider, MD  atorvastatin (LIPITOR) 80 MG tablet Take 80 mg by mouth daily. 06/10/15  Yes Historical Provider, MD  Calcium Carb-Cholecalciferol 600-500 MG-UNIT CAPS Take 1 capsule by mouth daily.   Yes Historical Provider, MD  Cholecalciferol (VITAMIN D) 2000 UNITS CAPS Take 1 capsule by mouth daily.   Yes Historical Provider, MD  Edoxaban Tosylate (SAVAYSA PO) Take 1 tablet by mouth daily.   Yes Historical Provider, MD  fluticasone (FLONASE) 50 MCG/ACT nasal spray Place 1-2 sprays into both nostrils daily. 05/29/15  Yes Historical Provider, MD  furosemide (LASIX) 40 MG tablet Take  40 mg by mouth every other day. For fluid 06/10/15  Yes Historical Provider, MD  glimepiride (AMARYL) 1 MG tablet Take 1 mg by mouth daily with breakfast.   Yes Historical Provider, MD  levothyroxine (SYNTHROID, LEVOTHROID) 50 MCG tablet Take 50 mcg by mouth daily. 07/14/15  Yes Historical Provider, MD  losartan (COZAAR) 100 MG tablet Take 100 mg by mouth daily.   Yes Historical Provider, MD  Omega-3 Fatty Acids (OMEGA-3 FISH OIL PO) Take 1 capsule by mouth daily.   Yes Historical Provider, MD  oxybutynin (DITROPAN XL) 15 MG 24 hr tablet Take 15 mg by mouth daily. 08/02/15  Yes Historical Provider, MD  pantoprazole (PROTONIX) 40 MG tablet Take 40 mg by mouth daily. 06/10/15  Yes Historical Provider, MD  predniSONE (DELTASONE) 5 MG tablet Take 5 mg by mouth daily with breakfast.   Yes Historical Provider, MD  rOPINIRole (REQUIP) 1 MG tablet Take 1 mg by mouth daily. 08/02/15  Yes Historical Provider, MD  azithromycin (ZITHROMAX) 250  MG tablet Take 250 mg by mouth daily. Azithromycin pack (5 day supply) 07/27/15   Historical Provider, MD  methylPREDNISolone (MEDROL DOSEPAK) 4 MG TBPK tablet  07/27/15   Historical Provider, MD    Scheduled Meds: . amiodarone  200 mg Oral Daily  . amLODipine  2.5 mg Oral Daily  . antiseptic oral rinse  7 mL Mouth Rinse BID  . atorvastatin  80 mg Oral Daily  . calcium-vitamin D  1 tablet Oral Daily  . cholecalciferol  2,000 Units Oral Daily  . fluticasone  1-2 spray Each Nare Daily  . glimepiride  1 mg Oral Q breakfast  . levothyroxine  50 mcg Oral QAC breakfast  . omega-3 acid ethyl esters  1 capsule Oral Daily  . oxybutynin  15 mg Oral Daily  . predniSONE  5 mg Oral Q breakfast  . rOPINIRole  1 mg Oral Daily  . sodium chloride  3 mL Intravenous Q12H   Infusions: . sodium chloride 75 mL/hr at 08/07/15 1945   PRN Meds: acetaminophen **OR** acetaminophen, ondansetron **OR** ondansetron (ZOFRAN) IV   Allergies as of 08/07/2015 - Review Complete 08/07/2015  Allergen Reaction Noted  . Clonidine derivatives Swelling 08/07/2015  . Statins Swelling 08/07/2015  . Tape Rash 08/07/2015    History reviewed. No pertinent family history.  Social History   Social History  . Marital Status: Divorced    Spouse Name: N/A  . Number of Children: N/A  . Years of Education: N/A   Occupational History  . Not on file.   Social History Main Topics  . Smoking status: Never Smoker   . Smokeless tobacco: Not on file  . Alcohol Use: No  . Drug Use: No  . Sexual Activity: No   Other Topics Concern  . Not on file   Social History Narrative  . No narrative on file    REVIEW OF SYSTEMS: Constitutional:  Weight stable.   ENT:  No nose bleeds Pulm:  Per HPI CV:  No palpitations, chronic LE edema.  GU:  No hematuria, no frequency GI:  Per HPI Heme:  Per HPI.  No excessive bleeding or bruising.    Transfusions:  None.  Neuro:  No headaches, no peripheral tingling or numbness MS:   Prednisone controls generalized pain.   Derm:  No itching, no rash or sores.  Endocrine:  No sweats or chills.  No polyuria or dysuria Immunization:  fluvax current.   Previous pneumovax Travel:  None beyond local  counties in last few months.    PHYSICAL EXAM: Vital signs in last 24 hours: Filed Vitals:   08/08/15 0245 08/08/15 0455  BP: 122/38 117/33  Pulse: 51 52  Temp: 98.7 F (37.1 C) 98.7 F (37.1 C)  Resp: 18 18   Wt Readings from Last 3 Encounters:  08/08/15 90.5 kg (199 lb 8.3 oz)    General: pleasant Head:  No swelling or asymmetry.  Eyes:  + conj pallor.  No icterus.  EOMI Ears:  HOH  Nose:  No discharge or congestion Mouth:  Clear , noist oral MM Neck:  No mass, no JVD, no TMG Lungs:  Clear bil.  No cough or dyspnea Heart: Reg rhythm.   Sinus brady in 50s on monitor. No MRG. Abdomen:  Soft, NT, ND.  No mass, bruits, hernias.  No HSM.    Rectal: did not repeat.  Black and FOBT + in ED   Musc/Skeltl: no joint erythema or swelling.  No contracutres or other deformities.  Extremities:  No CCE  Neurologic:  Oriented x 3.  Alert.  No tremor or involuntary limb movement.  Moves all 4s strength not tested. Skin:  No telangectasia, rash or sores Tattoos:  none Nodes:  No cervical adenopathy.    Psych:  Pleasant, cooperative, calm.   Intake/Output from previous day: 12/10 0701 - 12/11 0700 In: 920 [Blood:920] Out: 1325 [Urine:1325] Intake/Output this shift:    LAB RESULTS:  Recent Labs  08/07/15 1636 08/08/15 0520  WBC 11.2* 10.6*  HGB 4.7* 8.0*  HCT 14.7* 24.5*  PLT 162 145*   BMET Lab Results  Component Value Date   NA 137 08/07/2015   K 4.5 08/07/2015   CL 105 08/07/2015   CO2 22 08/07/2015   GLUCOSE 185* 08/07/2015   BUN 52* 08/07/2015   CREATININE 1.70* 08/07/2015   CALCIUM 8.6* 08/07/2015   LFT  Recent Labs  08/07/15 1636  PROT 5.4*  ALBUMIN 3.3*  AST 28  ALT 21  ALKPHOS 39  BILITOT 0.8   PT/INR No results found for: INR,  PROTIME Hepatitis Panel No results for input(s): HEPBSAG, HCVAB, HEPAIGM, HEPBIGM in the last 72 hours. C-Diff No components found for: CDIFF Lipase  No results found for: LIPASE  Drugs of Abuse  No results found for: LABOPIA, COCAINSCRNUR, LABBENZ, AMPHETMU, THCU, LABBARB   RADIOLOGY STUDIES: Dg Chest 2 View  08/07/2015  CLINICAL DATA:  Shortness of Breath EXAM: CHEST  2 VIEW COMPARISON:  None. FINDINGS: Cardiomegaly is noted. No acute infiltrate or pulmonary edema. Mild basilar atelectasis. There is aortic valve prosthesis. Osteopenia and degenerative changes thoracic spine IMPRESSION: No active cardiopulmonary disease. Electronically Signed   By: Natasha MeadLiviu  Pop M.D.   On: 08/07/2015 17:37    ENDOSCOPIC STUDIES: Per HPI  IMPRESSION:   *  GI bleed in pt on anticoagulation, Savaysa.  Rule out AVMs, ulcer (on PPI but also on chronic prednisone and recent medrol dose pack), neoplasia.   *  ABL anemia.  Symptomatic  Unknown baseline Hgb.  Hgb improved after 3 PRBCs.   *  Afib, s/p AVR.  On Savaysa  *  Type 2 DM.  Oral agents at home.   *  AKI vs CKD.   Baseline labs unknown.     PLAN:     *  Per Dr Russella DarStark.  ? Colon/EGD vs starting with EGD?   For standard/average risk of bleeding procedures, hold Savaysa 24 hours.  For high risk bleeding procedures, hold 48 hours.   *  Start protonix BID.  Leave on her current diet.   *  Follow CBC.     Jennye Moccasin  08/08/2015, 8:14 AM Pager: [phone]     Attending physician's note   I have taken a history, examined the patient and reviewed the chart. I agree with the Advanced Practitioner's note, impression and recommendations. Pt has noted melena for 1 week and fatigue, weakness. Hb=4.7 in ED. Pt on Savaysa for afib. Recommend PPI bid, transfusion to keep Hb >8 and colonoscopy/EGD tomorrow which will be 2 days off Savaysa.   Claudette Head, MD Clementeen Graham [phone] Mon-Fri 8a-5p [phone] after 5p, weekends, holidays

## 2015-08-09 ENCOUNTER — Encounter (HOSPITAL_COMMUNITY): Payer: Self-pay | Admitting: Registered Nurse

## 2015-08-09 ENCOUNTER — Inpatient Hospital Stay (HOSPITAL_COMMUNITY): Payer: Medicare Other | Admitting: Registered Nurse

## 2015-08-09 ENCOUNTER — Encounter (HOSPITAL_COMMUNITY)
Admission: EM | Disposition: A | Discharge status: Home / Self Care | Payer: Self-pay | Source: Home / Self Care | Attending: Internal Medicine

## 2015-08-09 DIAGNOSIS — K921 Melena: Principal | ICD-10-CM

## 2015-08-09 HISTORY — PX: COLONOSCOPY: SHX5424

## 2015-08-09 HISTORY — PX: ESOPHAGOGASTRODUODENOSCOPY: SHX5428

## 2015-08-09 LAB — TYPE AND SCREEN
ABO/RH(D): O POS
ANTIBODY SCREEN: NEGATIVE
Unit division: 0
Unit division: 0
Unit division: 0

## 2015-08-09 LAB — GLUCOSE, CAPILLARY: Glucose-Capillary: 76 mg/dL (ref 65–99)

## 2015-08-09 LAB — BASIC METABOLIC PANEL
Anion gap: 8 (ref 5–15)
BUN: 23 mg/dL — AB (ref 6–20)
CHLORIDE: 108 mmol/L (ref 101–111)
CO2: 24 mmol/L (ref 22–32)
CREATININE: 1.16 mg/dL — AB (ref 0.44–1.00)
Calcium: 8.5 mg/dL — ABNORMAL LOW (ref 8.9–10.3)
GFR calc Af Amer: 49 mL/min — ABNORMAL LOW (ref >60)
GFR calc non Af Amer: 43 mL/min — ABNORMAL LOW (ref >60)
Glucose, Bld: 79 mg/dL (ref 65–99)
Potassium: 3.9 mmol/L (ref 3.5–5.1)
Sodium: 140 mmol/L (ref 135–145)

## 2015-08-09 LAB — CBC
HEMATOCRIT: 26.9 % — AB (ref 36.0–46.0)
HEMOGLOBIN: 8.6 g/dL — AB (ref 12.0–15.0)
MCH: 28 pg (ref 26.0–34.0)
MCHC: 32 g/dL (ref 30.0–36.0)
MCV: 87.6 fL (ref 78.0–100.0)
Platelets: 171 10*3/uL (ref 150–400)
RBC: 3.07 MIL/uL — ABNORMAL LOW (ref 3.87–5.11)
RDW: 20.9 % — ABNORMAL HIGH (ref 11.5–15.5)
WBC: 13.4 10*3/uL — AB (ref 4.0–10.5)

## 2015-08-09 LAB — HEMOGLOBIN A1C
Hgb A1c MFr Bld: 6 % — ABNORMAL HIGH (ref 4.8–5.6)
MEAN PLASMA GLUCOSE: 126 mg/dL

## 2015-08-09 SURGERY — COLONOSCOPY
Anesthesia: Monitor Anesthesia Care

## 2015-08-09 MED ORDER — SODIUM CHLORIDE 0.9 % IV SOLN: INTRAVENOUS | Status: DC

## 2015-08-09 MED ORDER — LACTATED RINGERS IV SOLN
INTRAVENOUS | Status: DC | PRN
  Administered 2015-08-09: 11:00:00 via INTRAVENOUS

## 2015-08-09 MED ORDER — PROPOFOL 500 MG/50ML IV EMUL
INTRAVENOUS | Status: DC | PRN
  Administered 2015-08-09: 100 ug/kg/min via INTRAVENOUS

## 2015-08-09 MED ORDER — PROPOFOL 10 MG/ML IV BOLUS
INTRAVENOUS | Status: AC
  Filled 2015-08-09: qty 40

## 2015-08-09 MED ORDER — LIDOCAINE HCL (CARDIAC) 20 MG/ML IV SOLN
INTRAVENOUS | Status: DC | PRN
  Administered 2015-08-09: 50 mg via INTRAVENOUS

## 2015-08-09 MED ORDER — LIDOCAINE HCL (CARDIAC) 20 MG/ML IV SOLN
INTRAVENOUS | Status: AC
  Filled 2015-08-09: qty 5

## 2015-08-09 MED ORDER — GLYCOPYRROLATE 0.2 MG/ML IJ SOLN
INTRAMUSCULAR | Status: AC
  Filled 2015-08-09: qty 1

## 2015-08-09 MED ORDER — METOCLOPRAMIDE HCL 5 MG/ML IJ SOLN
10.0000 mg | Freq: Once | INTRAMUSCULAR | Status: AC
  Administered 2015-08-10: 10 mg via INTRAVENOUS
  Filled 2015-08-09: qty 2

## 2015-08-09 NOTE — Op Note (Signed)
[facility] [address]  [place], [id number]   COLONOSCOPY PROCEDURE REPORT  PATIENT: Tonya Spence, Tonya Spence  MR#: [id number] BIRTHDATE: [birth year] , 84  yrs. old GENDER: female ENDOSCOPIST: Iva Booparl E Siddhanth Denk, MD, [facility] PROCEDURE DATE:  08/09/2015 PROCEDURE:   Colonoscopy, diagnostic First Screening Colonoscopy - Avg.  risk and is 50 yrs.  old or older - No.  Prior Negative Screening - Now for repeat screening. N/A  History of Adenoma - Now for follow-up colonoscopy & has been > or = to 3 yrs.  N/A  Polyps removed today? No Recommend repeat exam, <10 yrs? No ASA CLASS:   Class III INDICATIONS:Evaluation of unexplained GI bleeding and Patient is not applicable for Colorectal Neoplasm Risk Assessment for this procedure. MEDICATIONS: Monitored anesthesia care and Per Anesthesia  DESCRIPTION OF PROCEDURE:   After the risks benefits and alternatives of the procedure were thoroughly explained, informed consent was obtained.  The digital rectal exam revealed no rectal mass and revealed decreased sphincter tone.   The [id number] ([id number]  endoscope was introduced through the anus and advanced to the terminal ileum which was intubated for a short distance. No adverse events experienced.   The quality of the prep was good. (MoviPrep was used)  The instrument was then slowly withdrawn as the colon was fully examined. Estimated blood loss is zero unless otherwise noted in this procedure report.      COLON FINDINGS: Tattoo in ascending colon - otherwise normal colon - there were internal hemorrhoids.  Retroflexed views revealed internal hemorrhoids. The time to cecum = 4.1 Withdrawal time = 8.4 The scope was withdrawn and the procedure completed. COMPLICATIONS: There were no immediate complications.  ENDOSCOPIC IMPRESSION: Tattoo in ascending colon ? prior polypectomy site - - otherwise normal colon - there were internal hemorrhoids  RECOMMENDATIONS: Will schedule small  bowel capsule endoscopy for tomorrow  eSigned:  Iva Booparl E Colinda Barth, MD, [facility] 08/09/2015 2:25 PM

## 2015-08-09 NOTE — Progress Notes (Signed)
Patient ID: Tonya Spence, female   DOB: July 16, 1931, 81 y.o.   MRN: [id number] [facility] PROGRESS NOTE  Tonya Spence WUJ:[id number] DOB: Jun 28, 1931 DOA: 08/07/2015 PCP: [name], MD  Brief narrative:    81 year old female with a history of atrial fibrillation, aortic stenosis status post valve replacement approximately one year ago (on [place]), diabetes (on glimepiride) who presented to [facility] ED for worsening weakness, shortness of breath over past few days prior to this admission. In ED, pt was hemodynamically stable. Blood work showed WBC count 11.2, hemoglobin 4.7, Creatinine 1.7. CXR showed no acute cardiopulmonary findings. FOBT positive in ED. patient has received total of 3 units of PRBC transfusion during this hospital stay. Per GI, plan is for endoscopy and colonoscopy today.  Assessment/Plan:    Principal Problem:  Acute lower GI bleeding - Likely triggered by [facility], aspirin - FOBT positive in ED - Savaysa and aspirin remain on hold due to risk of bleeding - Keep nothing by mouth for EGD and colonoscopy today - So far patient has received 3 units of PRBC transfusion with posttransfusion hemoglobin stable at 8.6. Patient reports no further bleeding noted.   Active Problems:  Restless leg syndrome - Continue requip   Leukocytosis - On low dose steroids - No evidence of acute infectious process   Essential hypertension - Continue Norvasc - Losartan and lasix held due to ARF   Acute renal failure (ARF) (HCC) - Due to lasix and losartan which were held on admission  - Creatinine down from 1.7-1.16.   Dyslipidemia associated with type 2 diabetes mellitus (HCC) - We will continue omega 3 and Lipitor   Other specified hypothyroidism - We will continue synthroid   Diabetes mellitus without complication, without long-term current use of insulin (HCC) - A1c pending as of 08/09/2015 - Continue glimepiride    Atrial fibrillation (HCC) /  Aortic stenosis s/p AV replacement  - CHADS vasc score 3 (age, gender, diabtees, hypertension). Not known if she has CHF so score may be even higher if there is h/o CHF - AC on hold due to risk of bleeding - Rate controlled without beta blockers  DVT Prophylaxis  - SCD's bilaterally   Code Status: Full.  Family Communication:  plan of care discussed with the patient Disposition Plan: Home likely by 08/10/2015 or depending on EGD, colonoscopy results.  IV access:  Peripheral IV  Procedures and diagnostic studies:    Dg Chest 2 View 08/07/2015 No active cardiopulmonary disease. Electronically Signed By: Natasha Mead M.D. On: 08/07/2015 17:37  Medical Consultants:  GI, Dr. Claudette Head  Other Consultants:  None   IAnti-Infectives:   None    Manson Passey, MD  [facility] Pager [phone]  Time spent in minutes: 15 minutes  If 7PM-7AM, please contact night-coverage [email or web address] Password [facility] 08/09/2015, 8:41 AM   LOS: 2 days    HPI/Subjective: No acute overnight events. Patient reports feeling okay this morning.  Objective: Filed Vitals:   08/08/15 0455 08/08/15 1300 08/08/15 2046 08/09/15 0436  BP: 117/33 139/45 139/39 152/41  Pulse: 52 56 55 55  Temp: 98.7 F (37.1 C) 97.8 F (36.6 C) 98 F (36.7 C) 98.6 F (37 C)  TempSrc: Oral Oral Oral Oral  Resp: Height:      Weight: 90.5 kg (199 lb 8.3 oz)   91.2 kg (201 lb 1 oz)  SpO2: 98% 96% 95% 98%    Intake/Output Summary (Last 24 hours) at 08/09/15 8657 Last data filed  at 08/08/15 2300  Gross per 24 hour  Intake 2403.75 ml  Output    550 ml  Net 1853.75 ml    Exam:   General:  Pt is not in acute distress  Cardiovascular: RRR, S1/S2 (+)  Respiratory: No wheezing, no crackles, no rhonchi  Abdomen: Nontender, nondistended abdomen  Extremities: Pulses palpable, no edema  Neuro: No focal deficits  Data Reviewed: Basic Metabolic Panel:  Recent Labs Lab 08/07/15 1636  08/09/15 0525  NA 137 140  K 4.5 3.9  CL 105 108  CO2 22 24  GLUCOSE 185* 79  BUN 52* 23*  CREATININE 1.70* 1.16*  CALCIUM 8.6* 8.5*   Liver Function Tests:  Recent Labs Lab 08/07/15 1636  AST 28  ALT 21  ALKPHOS 39  BILITOT 0.8  PROT 5.4*  ALBUMIN 3.3*   No results for input(s): LIPASE, AMYLASE in the last 168 hours. No results for input(s): AMMONIA in the last 168 hours. CBC:  Recent Labs Lab 08/07/15 1636 08/08/15 0520 08/08/15 1815 08/09/15 0525  WBC 11.2* 10.6* 13.3* 13.4*  NEUTROABS 9.6*  --   --   --   HGB 4.7* 8.0* 8.8* 8.6*  HCT 14.7* 24.5* 27.5* 26.9*  MCV 92.5 86.6 86.5 87.6  PLT 162 145* 164 171   Cardiac Enzymes: No results for input(s): CKTOTAL, CKMB, CKMBINDEX, TROPONINI in the last 168 hours. BNP: Invalid input(s): POCBNP CBG:  Recent Labs Lab 08/07/15 1610 08/08/15 0743 08/09/15 0752  GLUCAP 152* 93 76    No results found for this or any previous visit (from the past 240 hour(s)).   Scheduled Meds: . amiodarone  200 mg Oral Daily  . amLODipine  2.5 mg Oral Daily  . antiseptic oral rinse  7 mL Mouth Rinse BID  . atorvastatin  80 mg Oral Daily  . calcium-vitamin D  1 tablet Oral Daily  . cholecalciferol  2,000 Units Oral Daily  . fluticasone  1-2 spray Each Nare Daily  . glimepiride  1 mg Oral Q breakfast  . levothyroxine  50 mcg Oral QAC breakfast  . omega-3 acid ethyl esters  1 capsule Oral Daily  . oxybutynin  15 mg Oral Daily  . pantoprazole  40 mg Oral BID  . predniSONE  5 mg Oral Q breakfast  . rOPINIRole  1 mg Oral Daily  . sodium chloride  3 mL Intravenous Q12H   Continuous Infusions: . sodium chloride 75 mL/hr at 08/09/15 0225

## 2015-08-09 NOTE — Anesthesia Postprocedure Evaluation (Signed)
Anesthesia Post Note  Patient: Tonya Spence  Procedure(s) Performed: Procedure(s) (LRB): COLONOSCOPY (N/A) ESOPHAGOGASTRODUODENOSCOPY (EGD) (N/A)  Patient location during evaluation: PACU Anesthesia Type: MAC Level of consciousness: awake and alert Pain management: pain level controlled Vital Signs Assessment: post-procedure vital signs reviewed and stable Respiratory status: spontaneous breathing, nonlabored ventilation, respiratory function stable and patient connected to nasal cannula oxygen Cardiovascular status: blood pressure returned to baseline and stable Postop Assessment: no signs of nausea or vomiting Anesthetic complications: no    Last Vitals:  Filed Vitals:   08/09/15 1430 08/09/15 1448  BP: 103/56 116/32  Pulse: 57 57  Temp:  37.2 C  Resp: 22 18    Last Pain:  Filed Vitals:   08/09/15 1450  PainSc: 0-No pain                 Kaytee Taliercio L

## 2015-08-09 NOTE — Anesthesia Preprocedure Evaluation (Addendum)
Anesthesia Evaluation  Patient identified by MRN, date of birth, ID band Patient awake    Reviewed: Allergy & Precautions, H&P , NPO status , Patient's Chart, lab work & pertinent test results  Airway Mallampati: II  TM Distance: >3 FB Neck ROM: full    Dental no notable dental hx. (+) Dental Advisory Given, Teeth Intact   Pulmonary neg pulmonary ROS,    Pulmonary exam normal breath sounds clear to auscultation       Cardiovascular Exercise Tolerance: Good hypertension, Pt. on medications Normal cardiovascular exam+ dysrhythmias Atrial Fibrillation  Rhythm:regular Rate:Normal  AVR. Prolonged QT   Neuro/Psych negative neurological ROS  negative psych ROS   GI/Hepatic negative GI ROS, Neg liver ROS,   Endo/Other  Well Controlled, Type 2, Oral Hypoglycemic AgentsHypothyroidism Morbid obesity  Renal/GU negative Renal ROS  negative genitourinary   Musculoskeletal   Abdominal (+) + obese,   Peds  Hematology negative hematology ROS (+) anemia , hgb 8.6   Anesthesia Other Findings   Reproductive/Obstetrics negative OB ROS                            Anesthesia Physical Anesthesia Plan  ASA: III  Anesthesia Plan: MAC   Post-op Pain Management:    Induction:   Airway Management Planned:   Additional Equipment:   Intra-op Plan:   Post-operative Plan:   Informed Consent: I have reviewed the patients History and Physical, chart, labs and discussed the procedure including the risks, benefits and alternatives for the proposed anesthesia with the patient or authorized representative who has indicated his/her understanding and acceptance.   Dental Advisory Given  Plan Discussed with: CRNA and Surgeon  Anesthesia Plan Comments:         Anesthesia Quick Evaluation

## 2015-08-09 NOTE — Transfer of Care (Signed)
Immediate Anesthesia Transfer of Care Note  Patient: Tonya Spence  Procedure(s) Performed: Procedure(s): COLONOSCOPY (N/A) ESOPHAGOGASTRODUODENOSCOPY (EGD) (N/A)  Patient Location: PACU  Anesthesia Type:MAC  Level of Consciousness: awake, oriented and patient cooperative  Airway & Oxygen Therapy: Patient Spontanous Breathing and Patient connected to nasal cannula oxygen  Post-op Assessment: Report given to RN, Post -op Vital signs reviewed and stable and Patient moving all extremities X 4  Post vital signs: stable  Last Vitals:  Filed Vitals:   08/09/15 1321 08/09/15 1424  BP: 170/51 129/33  Pulse: 62   Temp: 37.1 C 36.6 C  Resp: 22 17    Complications: No apparent anesthesia complications

## 2015-08-09 NOTE — Op Note (Signed)
[facility] [address] [place], [id number]   ENDOSCOPY PROCEDURE REPORT  PATIENT: Tonya Spence, Tonya Spence  MR#: [id number] BIRTHDATE: [birth year] , 84  yrs. old GENDER: female ENDOSCOPIST: Iva Booparl E Hibah Odonnell, MD, [facility] PROCEDURE DATE:  08/09/2015 PROCEDURE:  EGD, diagnostic ASA CLASS:     Class III INDICATIONS:  melena. MEDICATIONS: Per Anesthesia and Monitored anesthesia care TOPICAL ANESTHETIC: none  DESCRIPTION OF PROCEDURE: After the risks benefits and alternatives of the procedure were thoroughly explained, informed consent was obtained.  The Pentax Gastroscope [id number] endoscope was introduced through the mouth and advanced to the second portion of the duodenum , Without limitations.  The instrument was slowly withdrawn as the mucosa was fully examined.      EXAM: The esophagus and gastroesophageal junction were completely normal in appearance.  The stomach was entered and closely examined.The antrum, angularis, and lesser curvature were well visualized, including a retroflexed view of the cardia and fundus. The stomach wall was normally distensable.  The scope passed easily through the pylorus into the duodenum.  Retroflexed views revealed no abnormalities.     The scope was then withdrawn from the patient and the procedure completed.  COMPLICATIONS: There were no immediate complications.  ENDOSCOPIC IMPRESSION: Normal appearing esophagus and GE junction, the stomach was well visualized and normal in appearance, normal appearing duodenum  RECOMMENDATIONS: Colonoscopy   eSigned:  Iva Booparl E Tevita Gomer, MD, [facility] 08/09/2015 2:27 PM

## 2015-08-10 ENCOUNTER — Encounter (HOSPITAL_COMMUNITY): Payer: Self-pay | Admitting: Internal Medicine

## 2015-08-10 ENCOUNTER — Encounter (HOSPITAL_COMMUNITY)
Admission: EM | Disposition: A | Discharge status: Home / Self Care | Payer: Self-pay | Source: Home / Self Care | Attending: Internal Medicine

## 2015-08-10 DIAGNOSIS — K921 Melena: Secondary | ICD-10-CM | POA: Insufficient documentation

## 2015-08-10 DIAGNOSIS — M353 Polymyalgia rheumatica: Secondary | ICD-10-CM | POA: Diagnosis present

## 2015-08-10 DIAGNOSIS — K922 Gastrointestinal hemorrhage, unspecified: Secondary | ICD-10-CM | POA: Diagnosis present

## 2015-08-10 HISTORY — PX: GIVENS CAPSULE STUDY: SHX5432

## 2015-08-10 LAB — CBC
HCT: 25 % — ABNORMAL LOW (ref 36.0–46.0)
Hemoglobin: 8.1 g/dL — ABNORMAL LOW (ref 12.0–15.0)
MCH: 28.5 pg (ref 26.0–34.0)
MCHC: 32.4 g/dL (ref 30.0–36.0)
MCV: 88 fL (ref 78.0–100.0)
PLATELETS: 158 10*3/uL (ref 150–400)
RBC: 2.84 MIL/uL — AB (ref 3.87–5.11)
RDW: 20.1 % — ABNORMAL HIGH (ref 11.5–15.5)
WBC: 14.4 10*3/uL — AB (ref 4.0–10.5)

## 2015-08-10 LAB — GLUCOSE, CAPILLARY: Glucose-Capillary: 75 mg/dL (ref 65–99)

## 2015-08-10 LAB — BASIC METABOLIC PANEL
ANION GAP: 6 (ref 5–15)
BUN: 13 mg/dL (ref 6–20)
CALCIUM: 8 mg/dL — AB (ref 8.9–10.3)
CO2: 23 mmol/L (ref 22–32)
Chloride: 106 mmol/L (ref 101–111)
Creatinine, Ser: 1.03 mg/dL — ABNORMAL HIGH (ref 0.44–1.00)
GFR, EST AFRICAN AMERICAN: 57 mL/min — AB (ref >60)
GFR, EST NON AFRICAN AMERICAN: 49 mL/min — AB (ref >60)
Glucose, Bld: 87 mg/dL (ref 65–99)
POTASSIUM: 3.5 mmol/L (ref 3.5–5.1)
SODIUM: 135 mmol/L (ref 135–145)

## 2015-08-10 SURGERY — IMAGING PROCEDURE, GI TRACT, INTRALUMINAL, VIA CAPSULE

## 2015-08-10 SURGICAL SUPPLY — 1 items: TOWEL COTTON PACK 4EA (MISCELLANEOUS) ×6 IMPLANT

## 2015-08-10 NOTE — Progress Notes (Signed)
     [facility] Progress Note  Subjective:  Hgb 8.1, S/P colon and EGD yesterday:  ENDOSCOPIC IMPRESSION: Normal appearing esophagus and GE junction, the stomach was well visualized and normal in appearance, normal appearing duodenum RECOMMENDATIONS: Colonoscopy ENDOSCOPIC IMPRESSION: Tattoo in ascending colon ? prior polypectomy site - - otherwise normal colon - there were internal hemorrhoids RECOMMENDATIONS: Will schedule small bowel capsule endoscopy   Objective:  Vital signs in last 24 hours: Temp:  [97.9 F (36.6 C)-99.9 F (37.7 C)] 99.9 F (37.7 C) (12/13 0550) Pulse Rate:  [57-66] 66 (12/13 0550) Resp:  [17-22] 18 (12/13 0550) BP: (103-170)/(32-56) 150/43 mmHg (12/13 0550) SpO2:  [92 %-100 %] 94 % (12/13 0550) Weight:  [198 lb 10.2 oz (90.1 kg)] 198 lb 10.2 oz (90.1 kg) (12/13 0550) Last BM Date: 08/09/15  General: Pt is alert, follows commands appropriately, not in acute distress  Cardiovascular: Regular rate and rhythm, S1/S2 appreciated   Respiratory: Clear to auscultation bilaterally, no wheezing, no crackles, no rhonchi  Abdomen: Soft, non tender, non distended, bowel sounds present  Extremities: No cyanosis, pulses DP and PT palpable bilaterally  Neuro: Grossly nonfocal  Lab Results:  Recent Labs  08/08/15 1815 08/09/15 0525 08/10/15 0539  WBC 13.3* 13.4* 14.4*  HGB 8.8* 8.6* 8.1*  HCT 27.5* 26.9* 25.0*  PLT 164 171 158     ASSESSMENT/PLAN:    81 yo female admitted with GI bleed on anticoagulation, Savaysa. EGD and colonoscopy nonrevealing. For capsule endoscopy today.     LOS: 3 days   Hvozdovic, Tollie PizzaLori P PA-C 08/10/2015, Pager [phone] Mon-Fri 8a-5p [phone] after 5p, weekends, holidays    [facility] Attending   Agree w/ Ms. Hvozdovic's note and mangement. Iva Booparl E. Micala Saltsman, MD, [facility] [phone] (pager) [phone] after 5 PM, weekends and holidays  08/10/2015 1:05 PM

## 2015-08-10 NOTE — Care Management Important Message (Signed)
Important Message  Patient Details  Name: Tonya GammonDarlyne Jessie Spence MRN: [id number] Date of Birth: 10-21-1930   Medicare Important Message Given:  Yes    Haskell FlirtJamison, Carmelita Amparo 08/10/2015, 9:22 AMImportant Message  Patient Details  Name: Tonya GammonDarlyne Jessie Spence MRN: [id number] Date of Birth: 10-21-1930   Medicare Important Message Given:  Yes    Haskell FlirtJamison, Gabreille Dardis 08/10/2015, 9:22 AM

## 2015-08-10 NOTE — Progress Notes (Signed)
Patient ID: Tonya Spence, female   DOB: 1931/02/06, 81 y.o.   MRN: [id number] [facility] PROGRESS NOTE  Kylina Tikesha Mort WUJ:[id number] DOB: 25-Apr-1931 DOA: 08/07/2015 PCP: [name], MD  Brief narrative:    81 year old female with a history of atrial fibrillation, aortic stenosis status post valve replacement approximately one year ago (on Savaysa), diabetes (on glimepiride), PMR (on chronic low dose prednisone), she recently moved from [place] in August 2016. About 2 weeks prior to this admission patient had cough and was diagnosed with bronchitis for which reason she was placed on a Medrol pack and Levaquin. She then started to develop black stool and decreased appetite in addition to weakness and shortness of breath especially with exertion.  In ED, pt was hemodynamically stable. Blood work showed WBC count 11.2, hemoglobin 4.7, Creatinine 1.7. CXR showed no acute cardiopulmonary findings. FOBT positive in ED. patient has received total of 3 units of PRBC transfusion during this hospital stay. Patient underwent endoscopy and colonoscopy 07/30/2015, both non-revealing. Plan is for capsule endoscopy today.  Barrier to discharge: Plan for capsule endoscopy today. Anticipated discharge by 08/11/2015 if results nonrevealing and if hemoglobin stable.  Assessment/Plan:    Principal Problem:  Acute upper and lower GI hemorrhage - Unclear etiology. - FOBT positive in ED - She is on anticoagulation Savaysa and also takes aspirin. Both placed on hold on admission.  - Her hemoglobin was 4.7 on the admission. She has received 3 units of PRBC during this hospital stay. - Hemoglobin is 8.1 this morning - Endoscopy and colonoscopy done 08/09/2015 are nonrevealing. - Per GI, plan is for capsule endoscopy today.  Active Problems:  Restless leg syndrome - Continue requip   Leukocytosis / PMR (HCC) - On low dose steroids - No evidence of acute infectious process   Essential  hypertension - Continue Norvasc - Losartan and lasix were eld due to ARF   Acute renal failure (ARF) (HCC) - Due to lasix and losartan which were held on admission  - Creatinine down from 1.7 to 1.03   Dyslipidemia associated with type 2 diabetes mellitus (HCC) - Continue omega 3 and Lipitor   Other specified hypothyroidism - Continue synthroid 50 mcg daily    Diabetes mellitus without complication, without long-term current use of insulin (HCC) - A1c on this admission 6.0 - Continue glimepiride    Atrial fibrillation (HCC) / Aortic stenosis s/p AV replacement  - CHADS vasc score 3 (age, gender, diabtees, hypertension). Not known if she has CHF so score may be even higher if there is h/o CHF - AC on hold due to risk of bleeding - She is on amiodarone for rate controlled.   DVT Prophylaxis  - SCD's bilaterally due to risk of bleed   Code Status: Full.  Family Communication:  plan of care discussed with the patient Disposition Plan: Capsule endoscopy today and if okay possible discharge tomorrow 08/11/2015.  IV access:  Peripheral IV  Procedures and diagnostic studies:    Dg Chest 2 View 08/07/2015 No active cardiopulmonary disease. Electronically Signed By: Natasha Mead M.D. On: 08/07/2015 17:37  EGD 08/09/2015  ENDOSCOPIC IMPRESSION: Normal appearing esophagus and GE junction, the stomach was well visualized and normal in appearance, normal appearing duodenum  Colonoscopy 08/09/2015 ENDOSCOPIC IMPRESSION: Tattoo in ascending colon ? prior polypectomy site - - otherwise normal colon - there were internal hemorrhoids  Medical Consultants:  GI, Dr. Claudette Head  Other Consultants:  None   IAnti-Infectives:   None    Emmani Lesueur,  MD  [facility] Pager [phone]  Time spent in minutes: 25 minutes  If 7PM-7AM, please contact night-coverage [email or web address] Password [email or web address] 08/10/2015, 10:52 AM   LOS: 3 days    HPI/Subjective: No acute overnight  events. Patient reports no further bleed.   Objective: Filed Vitals:   08/09/15 2034 08/10/15 0550 08/10/15 0852 08/10/15 0918  BP: 146/36 150/43 146/38   Pulse: 58 66 63   Temp: 99.1 F (37.3 C) 99.9 F (37.7 C)    TempSrc: Oral Oral    Resp: 18 18    Height:    4\' 11"  (1.499 m)  Weight:  90.1 kg (198 lb 10.2 oz)  91.173 kg (201 lb)  SpO2: 99% 94%      Intake/Output Summary (Last 24 hours) at 08/10/15 1052 Last data filed at 08/10/15 0700  Gross per 24 hour  Intake   3500 ml  Output   1600 ml  Net   1900 ml    Exam:   General:  Pt is alert, awake, no distress  Cardiovascular: Rate controlled, appreciate S1, S2   Respiratory: Bilateral air entry, no wheezing  Abdomen: Appreciate bowel sounds, nontender abdomen  Extremities: No leg swelling, pulses palpable  Neuro: Nonfocal  Data Reviewed: Basic Metabolic Panel:  Recent Labs Lab 08/07/15 1636 08/09/15 0525 08/10/15 0539  NA 137 140 135  K 4.5 3.9 3.5  CL 105 108 106  CO2 22 24 23   GLUCOSE 185* 79 87  BUN 52* 23* 13  CREATININE 1.70* 1.16* 1.03*  CALCIUM 8.6* 8.5* 8.0*   Liver Function Tests:  Recent Labs Lab 08/07/15 1636  AST 28  ALT 21  ALKPHOS 39  BILITOT 0.8  PROT 5.4*  ALBUMIN 3.3*   No results for input(s): LIPASE, AMYLASE in the last 168 hours. No results for input(s): AMMONIA in the last 168 hours. CBC:  Recent Labs Lab 08/07/15 1636 08/08/15 0520 08/08/15 1815 08/09/15 0525 08/10/15 0539  WBC 11.2* 10.6* 13.3* 13.4* 14.4*  NEUTROABS 9.6*  --   --   --   --   HGB 4.7* 8.0* 8.8* 8.6* 8.1*  HCT 14.7* 24.5* 27.5* 26.9* 25.0*  MCV 92.5 86.6 86.5 87.6 88.0  PLT 162 145* 164 171 158   Cardiac Enzymes: No results for input(s): CKTOTAL, CKMB, CKMBINDEX, TROPONINI in the last 168 hours. BNP: Invalid input(s): POCBNP CBG:  Recent Labs Lab 08/07/15 1610 08/08/15 0743 08/09/15 0752 08/10/15 0740  GLUCAP 152* 93 76 75    No results found for this or any previous visit  (from the past 240 hour(s)).   Scheduled Meds: . [MAR Hold] amiodarone  200 mg Oral Daily  . [MAR Hold] amLODipine  2.5 mg Oral Daily  . [MAR Hold] antiseptic oral rinse  7 mL Mouth Rinse BID  . [MAR Hold] atorvastatin  80 mg Oral Daily  . [MAR Hold] calcium-vitamin D  1 tablet Oral Daily  . [MAR Hold] cholecalciferol  2,000 Units Oral Daily  . [MAR Hold] fluticasone  1-2 spray Each Nare Daily  . [MAR Hold] glimepiride  1 mg Oral Q breakfast  . [MAR Hold] levothyroxine  50 mcg Oral QAC breakfast  . metoCLOPramide (REGLAN) injection  10 mg Intravenous Once  . [MAR Hold] omega-3 acid ethyl esters  1 capsule Oral Daily  . [MAR Hold] oxybutynin  15 mg Oral Daily  . [MAR Hold] pantoprazole  40 mg Oral BID  . [MAR Hold] predniSONE  5 mg Oral Q breakfast  . [MAR Hold] rOPINIRole  1 mg Oral Daily  . [MAR Hold] sodium chloride  3 mL Intravenous Q12H   Continuous Infusions:

## 2015-08-11 ENCOUNTER — Encounter (HOSPITAL_COMMUNITY): Payer: Self-pay | Admitting: Internal Medicine

## 2015-08-11 DIAGNOSIS — E785 Hyperlipidemia, unspecified: Secondary | ICD-10-CM | POA: Insufficient documentation

## 2015-08-11 DIAGNOSIS — I10 Essential (primary) hypertension: Secondary | ICD-10-CM

## 2015-08-11 DIAGNOSIS — E089 Diabetes mellitus due to underlying condition without complications: Secondary | ICD-10-CM

## 2015-08-11 DIAGNOSIS — E039 Hypothyroidism, unspecified: Secondary | ICD-10-CM

## 2015-08-11 DIAGNOSIS — I482 Chronic atrial fibrillation: Secondary | ICD-10-CM

## 2015-08-11 DIAGNOSIS — G2581 Restless legs syndrome: Secondary | ICD-10-CM

## 2015-08-11 DIAGNOSIS — K922 Gastrointestinal hemorrhage, unspecified: Secondary | ICD-10-CM

## 2015-08-11 DIAGNOSIS — N182 Chronic kidney disease, stage 2 (mild): Secondary | ICD-10-CM | POA: Insufficient documentation

## 2015-08-11 DIAGNOSIS — M353 Polymyalgia rheumatica: Secondary | ICD-10-CM

## 2015-08-11 LAB — BASIC METABOLIC PANEL
ANION GAP: 9 (ref 5–15)
BUN: 14 mg/dL (ref 6–20)
CO2: 22 mmol/L (ref 22–32)
Calcium: 8 mg/dL — ABNORMAL LOW (ref 8.9–10.3)
Chloride: 108 mmol/L (ref 101–111)
Creatinine, Ser: 1.06 mg/dL — ABNORMAL HIGH (ref 0.44–1.00)
GFR calc Af Amer: 55 mL/min — ABNORMAL LOW (ref >60)
GFR, EST NON AFRICAN AMERICAN: 48 mL/min — AB (ref >60)
GLUCOSE: 132 mg/dL — AB (ref 65–99)
POTASSIUM: 3.6 mmol/L (ref 3.5–5.1)
Sodium: 139 mmol/L (ref 135–145)

## 2015-08-11 LAB — GLUCOSE, CAPILLARY: Glucose-Capillary: 119 mg/dL — ABNORMAL HIGH (ref 65–99)

## 2015-08-11 LAB — CBC
HEMATOCRIT: 24.5 % — AB (ref 36.0–46.0)
Hemoglobin: 7.8 g/dL — ABNORMAL LOW (ref 12.0–15.0)
MCH: 28.2 pg (ref 26.0–34.0)
MCHC: 31.8 g/dL (ref 30.0–36.0)
MCV: 88.4 fL (ref 78.0–100.0)
PLATELETS: 165 10*3/uL (ref 150–400)
RBC: 2.77 MIL/uL — AB (ref 3.87–5.11)
RDW: 19.8 % — ABNORMAL HIGH (ref 11.5–15.5)
WBC: 10.5 10*3/uL (ref 4.0–10.5)

## 2015-08-11 MED ORDER — POLYSACCHARIDE IRON COMPLEX 150 MG PO CAPS: 150.0000 mg | ORAL_CAPSULE | Freq: Two times a day (BID) | ORAL | Status: AC

## 2015-08-11 MED ORDER — LOSARTAN POTASSIUM 100 MG PO TABS: 50.0000 mg | ORAL_TABLET | Freq: Every morning | ORAL | Status: AC

## 2015-08-11 MED ORDER — PANTOPRAZOLE SODIUM 40 MG PO TBEC: 40.0000 mg | DELAYED_RELEASE_TABLET | Freq: Two times a day (BID) | ORAL | Status: AC

## 2015-08-11 MED ORDER — APIXABAN 2.5 MG PO TABS: 2.5000 mg | ORAL_TABLET | Freq: Two times a day (BID) | ORAL | Status: AC

## 2015-08-11 MED ORDER — POLYSACCHARIDE IRON COMPLEX 150 MG PO CAPS
150.0000 mg | ORAL_CAPSULE | Freq: Two times a day (BID) | ORAL | Status: DC
Start: 2015-08-11 — End: 2015-08-11
  Filled 2015-08-11: qty 1

## 2015-08-11 NOTE — Progress Notes (Signed)
     [facility] Progress Note  Subjective: Hgb 7.8. S/P cap endo yesterday--endo to collect belt late this a.m. Feels well. No further dark stools.    Objective:  Vital signs in last 24 hours: Temp:  [98.4 F (36.9 C)-99.4 F (37.4 C)] 98.4 F (36.9 C) (12/14 0520) Pulse Rate:  [62-69] 62 (12/14 0520) Resp:  [20] 20 (12/14 0520) BP: (126-139)/(35-54) 126/35 mmHg (12/14 0520) SpO2:  [92 %-95 %] 95 % (12/14 0520) Weight:  [198 lb 3.2 oz (89.903 kg)-201 lb (91.173 kg)] 198 lb 3.2 oz (89.903 kg) (12/14 0520) Last BM Date: 08/10/15 General:   Alert,  Well-developed,    in NAD Heart:  Regular rate and rhythm; no murmurs Pulm;lungs clear Abdomen:  Soft, nontender and nondistended. Normal bowel sounds, without guarding, and without rebound.   Extremities:  Without edema. Neurologic:  Alert and  oriented x4;  grossly normal neurologically. Psych: Alert and cooperative. Normal mood and affect.   Lab Results:  Recent Labs  08/09/15 0525 08/10/15 0539 08/11/15 0445  WBC 13.4* 14.4* 10.5  HGB 8.6* 8.1* 7.8*  HCT 26.9* 25.0* 24.5*  PLT 171 158 165   BMET  Recent Labs  08/09/15 0525 08/10/15 0539 08/11/15 0445  NA 140 135 139  K 3.9 3.5 3.6  CL 108 106 108  CO2 24 23 22   GLUCOSE 79 87 132*  BUN 23* 13 14  CREATININE 1.16* 1.03* 1.06*  CALCIUM 8.5* 8.0* 8.0*     ASSESSMENT/PLAN:  81 yo female s/p AVR earlier this year, admitted with GI blled on anticoagulation with Savaysa. Colonoscopy and EGD nonrevealing, cap endo pending (? SB AVMs?) Trend Hgb.(Has rec 3 units prbcs this admission thus far).      LOS: 4 days   Hvozdovic, Tollie PizzaLori P PA-C 08/11/2015, Pager [phone] Mon-Fri 8a-5p [phone] after 5p, weekends, holidays     [facility] Attending   Agree w/ Ms. Hvozdovic's note and mangement.  Will try to get capsule endoscopy read by tomorrow - she could be discharged before that is read I think. I am ok with restarting anti-coagulant.  Iva Booparl E.  Gessner, MD, [facility] [phone] after 5 PM, weekends and holidays  08/11/2015 1:35 PM

## 2015-08-11 NOTE — Discharge Summary (Signed)
Physician Discharge Summary  Tonya GammonDarlyne Jessie Spence ZOX:[id number] DOB: 1930/10/13 DOA: 08/07/2015  PCP: Ninetta LightsFURR,SARA, MD  Admit date: 08/07/2015 Discharge date: 08/11/2015  Time spent: 40 minutes  Recommendations for Outpatient Follow-up:  1. Repeat CBC to follow Hgb trend 2. Repeat BMET to follow electrolytes and renal function 3. GI will follow up on final capsule endoscopy results 4. Cardiology will contact her with appointment details    Discharge Diagnoses:  Acute GI hemorrhage Restless leg syndrome Leukocytosis Essential hypertension Acute on chornic renal failure (ARF) (HCC) HLD/Dyslipidemia associated with type 2 diabetes mellitus (HCC) Hypothyroidism Diabetes mellitus without long-term current use of insulin (HCC) Chronic Paroxysmal Atrial fibrillation (HCC) PMR (polymyalgia rheumatica) (HCC)   Discharge Condition: stable and improved. No further signs of active GI bleed. Will follow wup with PCP in 1 week  Diet recommendation: heart healthy and low carbohydrates diet   Filed Weights   08/10/15 0550 08/10/15 0918 08/11/15 0520  Weight: 90.1 kg (198 lb 10.2 oz) 91.173 kg (201 lb) 89.903 kg (198 lb 3.2 oz)    History of present illness:  81 year old female with a history of atrial fibrillation, aortic stenosis status post valve replacement approximately one year ago (on Savaysa), diabetes (on glimepiride), PMR (on chronic low dose prednisone), [place]. About 2 weeks prior to this admission patient had cough and was diagnosed with bronchitis for which reason she was placed on a Medrol pack and Levaquin. She then started to develop black stool and decreased appetite in addition to weakness and shortness of breath especially with exertion.  In ED, pt was hemodynamically stable. Blood work showed WBC count 11.2, hemoglobin 4.7, Creatinine 1.7. CXR showed no acute cardiopulmonary findings. FOBT positive in ED.   Acute GI hemorrhage:  unspecified upper vs Lower; suspected due to AVM in small intestine  - Unclear etiology. - FOBT positive in ED - She was on anticoagulation Savaysa and also aspirin prior to admission. Both placed on hold on admission.  -EGD and colonoscopy unrevealing -capsule endoscopy with pending final results -Per GI ok to discharge and ok to resume anticoagulation -will d/c ASA -Will discharge on Eliquis (to be resumed on 08/16/15) -Patient on niferex BID -CBC as an outpatient to follow Hgb trend   Restless leg syndrome - Continue requip   Leukocytosis / PMR (HCC) - On low dose steroids - No evidence of acute infectious process appreciated   Essential hypertension - Continue Norvasc - Losartan and lasix were held due to ARF; ok to resume at discharge - Patient advise to keep herself well hydrated    Acute on chronic renal failure (ARF) (HCC): stage 2-3 at baseline  - Due to lasix and losartan which were held on admission  - Creatinine down from 1.7 to 1.06 - advise to maintain good hydration and will follow BMET as an outpatient -most likely associated to decrease perfusion with ongoing GIB   Hyperlipidemia/Dyslipidemia associated with type 2 diabetes mellitus (HCC) - Continue omega 3 and Lipitor   Other specified hypothyroidism - Continue synthroid 50 mcg daily  - PRN miralax for associated constipation    Diabetes mellitus without long-term current use of insulin (HCC) - A1c on this admission 6.0 - Continue glimepiride  - advise to watch ingestion of carbohydrates    Paroxysmal Atrial fibrillation (HCC) / Aortic stenosis s/p AV replacement  - CHADS vasc score 4 (age, gender, diabetes, hypertension, diastolic HF).  - Anticoagulation using eliquis to be resumed on 12/19 - no need  for ASA - She is on amiodarone for rate control.   Will follow up with cardiology service for further evaluation and treatment decision as an outpatient  Diastolic Heart Failure: Chronic   -compensated and stable -will continue home medication regimen -advise to follow low sodium diet and to check daily weights   Procedures:  EGD 08/09/2015 ENDOSCOPIC IMPRESSION: Normal appearing esophagus and GE junction, the stomach was well visualized and normal in appearance, normal appearing duodenum   Colonoscopy 08/09/2015 ENDOSCOPIC IMPRESSION  Capsule endoscopy 08/10/15: results pending at discharge  Consultations:  GI  Discharge Exam: Filed Vitals:   08/10/15 2133 08/11/15 0520  BP: 129/39 126/35  Pulse: 63 62  Temp: 98.8 F (37.1 C) 98.4 F (36.9 C)  Resp: 20 20    General: Pt is alert, awake, no distress; denies CP, SOB and lightheadedness   Cardiovascular: Rate controlled, appreciate S1, S2; no gallops  Respiratory: Bilateral air entry, no wheezing, no rales  Abdomen: Appreciated bowel sounds, nontender abdomen; Soft  Extremities: No leg swelling, pulses palpable  Neuro: Nonfocal   Discharge Instructions   Discharge Instructions    Diet - low sodium heart healthy    Complete by:  As directed      Discharge instructions    Complete by:  As directed   Follow a heart healthy diet Take medications as prescribed Do not resume Eliquis until Monday 08-16-15 (2.5mg  BID) Keep yourself well hydrated Follow up with PCP in 1 week Cardiology Office will contact you with appointment details          Current Discharge Medication List    START taking these medications   Details  apixaban (ELIQUIS) 2.5 MG TABS tablet Take 1 tablet (2.5 mg total) by mouth 2 (two) times daily. Qty: 60 tablet, Refills: 1    iron polysaccharides (NIFEREX) 150 MG capsule Take 1 capsule (150 mg total) by mouth 2 (two) times daily. Qty: 60 capsule, Refills: 1      CONTINUE these medications which have CHANGED   Details  losartan (COZAAR) 100 MG tablet Take 0.5 tablets (50 mg total) by mouth every morning.    pantoprazole (PROTONIX) 40 MG tablet Take 1 tablet (40 mg  total) by mouth 2 (two) times daily. Qty: 60 tablet, Refills: 1      CONTINUE these medications which have NOT CHANGED   Details  amiodarone (PACERONE) 200 MG tablet Take 200 mg by mouth daily.    amLODipine (NORVASC) 2.5 MG tablet Take 2.5 mg by mouth daily.    atorvastatin (LIPITOR) 80 MG tablet Take 80 mg by mouth daily.    Calcium Carb-Cholecalciferol 600-500 MG-UNIT CAPS Take 1 capsule by mouth daily.    Cholecalciferol (VITAMIN D) 2000 UNITS CAPS Take 1 capsule by mouth daily.    fluticasone (FLONASE) 50 MCG/ACT nasal spray Place 1-2 sprays into both nostrils daily.    furosemide (LASIX) 40 MG tablet Take 40 mg by mouth every other day. For fluid    glimepiride (AMARYL) 1 MG tablet Take 1 mg by mouth daily with breakfast.    levothyroxine (SYNTHROID, LEVOTHROID) 50 MCG tablet Take 50 mcg by mouth daily before breakfast.     Omega-3 Fatty Acids (OMEGA-3 FISH OIL PO) Take 1 capsule by mouth daily.    oxybutynin (DITROPAN XL) 15 MG 24 hr tablet Take 15 mg by mouth daily.    predniSONE (DELTASONE) 5 MG tablet Take 5 mg by mouth daily with breakfast.    rOPINIRole (REQUIP) 1 MG tablet Take  1 mg by mouth daily.    vitamin B-12 (CYANOCOBALAMIN) 1000 MCG tablet Take 1,000 mcg by mouth daily.      STOP taking these medications     aspirin EC 81 MG tablet      edoxaban (SAVAYSA) 60 MG TABS tablet      azithromycin (ZITHROMAX) 250 MG tablet      methylPREDNISolone (MEDROL DOSEPAK) 4 MG TBPK tablet        Allergies  Allergen Reactions  . Clonidine Derivatives Swelling  . Statins Swelling    Tolerates atorvastatin (Lipitor) - home med  . Tape Rash    The results of significant diagnostics from this hospitalization (including imaging, microbiology, ancillary and laboratory) are listed below for reference.    Significant Diagnostic Studies: Dg Chest 2 View  08/07/2015  CLINICAL DATA:  Shortness of Breath EXAM: CHEST  2 VIEW COMPARISON:  None. FINDINGS: Cardiomegaly  is noted. No acute infiltrate or pulmonary edema. Mild basilar atelectasis. There is aortic valve prosthesis. Osteopenia and degenerative changes thoracic spine IMPRESSION: No active cardiopulmonary disease. Electronically Signed   By: Natasha Mead M.D.   On: 08/07/2015 17:37    Labs: Basic Metabolic Panel:  Recent Labs Lab 08/07/15 1636 08/09/15 0525 08/10/15 0539 08/11/15 0445  NA 137 140 135 139  K 4.5 3.9 3.5 3.6  CL 105 108 106 108  CO2 GLUCOSE 185* 79 87 132*  BUN 52* 23* 13 14  CREATININE 1.70* 1.16* 1.03* 1.06*  CALCIUM 8.6* 8.5* 8.0* 8.0*   Liver Function Tests:  Recent Labs Lab 08/07/15 1636  AST 28  ALT 21  ALKPHOS 39  BILITOT 0.8  PROT 5.4*  ALBUMIN 3.3*   CBC:  Recent Labs Lab 08/07/15 1636 08/08/15 0520 08/08/15 1815 08/09/15 0525 08/10/15 0539 08/11/15 0445  WBC 11.2* 10.6* 13.3* 13.4* 14.4* 10.5  NEUTROABS 9.6*  --   --   --   --   --   HGB 4.7* 8.0* 8.8* 8.6* 8.1* 7.8*  HCT 14.7* 24.5* 27.5* 26.9* 25.0* 24.5*  MCV 92.5 86.6 86.5 87.6 88.0 88.4  PLT 162 145* 164 171 158 165   BNP (last 3 results)  Recent Labs  08/07/15 1640  BNP 305.1*    CBG:  Recent Labs Lab 08/07/15 1610 08/08/15 0743 08/09/15 0752 08/10/15 0740 08/11/15 0731  GLUCAP 152* 93 76 75 119*    Signed:  Vassie Loll  [facility] 08/11/2015, 3:00 PM

## 2015-08-11 NOTE — Care Management Note (Signed)
Case Management Note  Patient Details  Name: Tonya GammonDarlyne Jessie Spence MRN: [id number] Date of Birth: [birth year]  Subjective/Objective:      Admitted with GIB              Action/Plan: Discharge planning, spoke with patient at bedside. Patient states she lives at home with family. They assist her with meals and transportation. States she has a cane for ambulation but would like a walker. Contacted attending for order, will have [facility] to deliver to room. Feels she is at baseline with some slight weakness in her legs she attributes to laying in bed. Ambulating in room.   Expected Discharge Date:                  Expected Discharge Plan:  Home/Self Care  In-House Referral:  NA  Discharge planning Services  CM Consult  Post Acute Care Choice:  NA Choice offered to:  NA  DME Arranged:  N/A DME Agency:  NA  HH Arranged:  NA HH Agency:  NA  Status of Service:  Completed, signed off  Medicare Important Message Given:  Yes Date Medicare IM Given:    Medicare IM give by:    Date Additional Medicare IM Given:    Additional Medicare Important Message give by:     If discussed at Long Length of Stay Meetings, dates discussed:    Additional Comments:  Alexis Goodelleele, Arraya Buck K, RN 08/11/2015, 12:32 PM

## 2015-08-12 ENCOUNTER — Telehealth: Payer: Self-pay

## 2015-08-12 DIAGNOSIS — K625 Hemorrhage of anus and rectum: Secondary | ICD-10-CM

## 2015-08-12 NOTE — Telephone Encounter (Signed)
-----   Message from Iva Booparl E Gessner, MD sent at 08/12/2015  1:17 PM EST ----- Regarding: capsule f/u Her capsule malfunctioned and we will need to have her do as an outpt  Dx is GI bleed, blood in stool   She also needs a CBC next week please  I think she is discharged to home at this point

## 2015-08-12 NOTE — Telephone Encounter (Signed)
I spoke with her assistant.  She will bring her next week for a CBC Capsule endo scheduled for 09/01/15 8:00 I mailed her instructions.

## 2015-08-16 ENCOUNTER — Other Ambulatory Visit: Payer: Self-pay | Admitting: *Deleted

## 2015-08-16 DIAGNOSIS — K921 Melena: Secondary | ICD-10-CM

## 2015-08-16 DIAGNOSIS — K922 Gastrointestinal hemorrhage, unspecified: Secondary | ICD-10-CM

## 2015-08-19 ENCOUNTER — Other Ambulatory Visit: Payer: Self-pay | Admitting: Internal Medicine

## 2015-08-19 ENCOUNTER — Other Ambulatory Visit (INDEPENDENT_AMBULATORY_CARE_PROVIDER_SITE_OTHER): Payer: Medicare Other

## 2015-08-19 DIAGNOSIS — K922 Gastrointestinal hemorrhage, unspecified: Secondary | ICD-10-CM | POA: Diagnosis not present

## 2015-08-19 DIAGNOSIS — K921 Melena: Secondary | ICD-10-CM | POA: Diagnosis not present

## 2015-08-19 LAB — CBC WITH DIFFERENTIAL/PLATELET
BASOS ABS: 0.1 10*3/uL (ref 0.0–0.1)
Basophils Relative: 0.7 % (ref 0.0–3.0)
EOS ABS: 0.1 10*3/uL (ref 0.0–0.7)
Eosinophils Relative: 0.9 % (ref 0.0–5.0)
HCT: 32.9 % — ABNORMAL LOW (ref 36.0–46.0)
HEMOGLOBIN: 10.4 g/dL — AB (ref 12.0–15.0)
Lymphocytes Relative: 27.3 % (ref 12.0–46.0)
Lymphs Abs: 2.8 10*3/uL (ref 0.7–4.0)
MCHC: 31.5 g/dL (ref 30.0–36.0)
MCV: 85.6 fl (ref 78.0–100.0)
MONO ABS: 0.4 10*3/uL (ref 0.1–1.0)
Monocytes Relative: 4.2 % (ref 3.0–12.0)
NEUTROS PCT: 66.9 % (ref 43.0–77.0)
Neutro Abs: 6.8 10*3/uL (ref 1.4–7.7)
Platelets: 309 10*3/uL (ref 150.0–400.0)
RBC: 3.85 Mil/uL — AB (ref 3.87–5.11)
RDW: 19.8 % — ABNORMAL HIGH (ref 11.5–15.5)
WBC: 10.2 10*3/uL (ref 4.0–10.5)

## 2015-08-19 NOTE — Progress Notes (Signed)
Quick Note:  Please let her know blood count much better 10.4 Awaiting repeat capsule endoscopy ______

## 2015-09-01 ENCOUNTER — Ambulatory Visit (INDEPENDENT_AMBULATORY_CARE_PROVIDER_SITE_OTHER): Payer: Medicare Other | Admitting: Internal Medicine

## 2015-09-01 DIAGNOSIS — K635 Polyp of colon: Secondary | ICD-10-CM | POA: Diagnosis not present

## 2015-09-01 DIAGNOSIS — K5520 Angiodysplasia of colon without hemorrhage: Secondary | ICD-10-CM

## 2015-09-01 DIAGNOSIS — K6381 Dieulafoy lesion of intestine: Secondary | ICD-10-CM

## 2015-09-01 DIAGNOSIS — K921 Melena: Secondary | ICD-10-CM | POA: Diagnosis not present

## 2015-09-01 DIAGNOSIS — K6389 Other specified diseases of intestine: Secondary | ICD-10-CM

## 2015-09-01 NOTE — Progress Notes (Signed)
Pt here for capsule endo and tolerated procedure well.   Capsule lot#[id number] Exp:  07/2016

## 2015-09-09 ENCOUNTER — Emergency Department (HOSPITAL_COMMUNITY)
Admission: EM | Admit: 2015-09-09 | Discharge: 2015-09-09 | Discharge status: Against Medical Advice | Payer: Medicare Other | Attending: Emergency Medicine | Admitting: Emergency Medicine

## 2015-09-09 ENCOUNTER — Encounter (HOSPITAL_COMMUNITY): Payer: Self-pay | Admitting: Emergency Medicine

## 2015-09-09 ENCOUNTER — Emergency Department (HOSPITAL_COMMUNITY): Payer: Medicare Other

## 2015-09-09 DIAGNOSIS — R05 Cough: Secondary | ICD-10-CM | POA: Insufficient documentation

## 2015-09-09 DIAGNOSIS — R630 Anorexia: Secondary | ICD-10-CM | POA: Diagnosis not present

## 2015-09-09 DIAGNOSIS — R682 Dry mouth, unspecified: Secondary | ICD-10-CM | POA: Diagnosis not present

## 2015-09-09 DIAGNOSIS — R509 Fever, unspecified: Secondary | ICD-10-CM | POA: Diagnosis not present

## 2015-09-09 LAB — CBC
HEMATOCRIT: 34 % — AB (ref 36.0–46.0)
Hemoglobin: 10.7 g/dL — ABNORMAL LOW (ref 12.0–15.0)
MCH: 27.7 pg (ref 26.0–34.0)
MCHC: 31.5 g/dL (ref 30.0–36.0)
MCV: 88.1 fL (ref 78.0–100.0)
Platelets: 182 10*3/uL (ref 150–400)
RBC: 3.86 MIL/uL — ABNORMAL LOW (ref 3.87–5.11)
RDW: 17.7 % — AB (ref 11.5–15.5)
WBC: 10.3 10*3/uL (ref 4.0–10.5)

## 2015-09-09 LAB — COMPREHENSIVE METABOLIC PANEL
ALBUMIN: 3.5 g/dL (ref 3.5–5.0)
ALT: 26 U/L (ref 14–54)
AST: 42 U/L — AB (ref 15–41)
Alkaline Phosphatase: 56 U/L (ref 38–126)
Anion gap: 12 (ref 5–15)
BUN: 21 mg/dL — AB (ref 6–20)
CHLORIDE: 104 mmol/L (ref 101–111)
CO2: 23 mmol/L (ref 22–32)
CREATININE: 1.46 mg/dL — AB (ref 0.44–1.00)
Calcium: 8.8 mg/dL — ABNORMAL LOW (ref 8.9–10.3)
GFR calc non Af Amer: 32 mL/min — ABNORMAL LOW (ref >60)
GFR, EST AFRICAN AMERICAN: 37 mL/min — AB (ref >60)
Glucose, Bld: 88 mg/dL (ref 65–99)
Potassium: 3.5 mmol/L (ref 3.5–5.1)
SODIUM: 139 mmol/L (ref 135–145)
Total Bilirubin: 1.1 mg/dL (ref 0.3–1.2)
Total Protein: 6.5 g/dL (ref 6.5–8.1)

## 2015-09-09 LAB — I-STAT CG4 LACTIC ACID, ED: Lactic Acid, Venous: 1.29 mmol/L (ref 0.5–2.0)

## 2015-09-09 NOTE — ED Notes (Signed)
Pt c/o coughing x 2 days, anorexia, dry mouth, fever 105 F at home onset today, took tylenol at 1100, fever currently 100.5 F. No cancer, chemo, radiation.

## 2015-09-13 ENCOUNTER — Telehealth: Payer: Self-pay

## 2015-09-13 NOTE — Telephone Encounter (Signed)
Patient notified of the results of capsule endo.  [place] I have mailed a copy to her

## 2015-09-14 LAB — CULTURE, BLOOD (ROUTINE X 2)
CULTURE: NO GROWTH
Culture: NO GROWTH

## 2017-03-06 IMAGING — CR DG CHEST 2V
2 series · 2 of 2 positions shown · non-contrast
Comparison: None.

CLINICAL DATA: Shortness of Breath

EXAM:
CHEST  2 VIEW

[w chest lat]
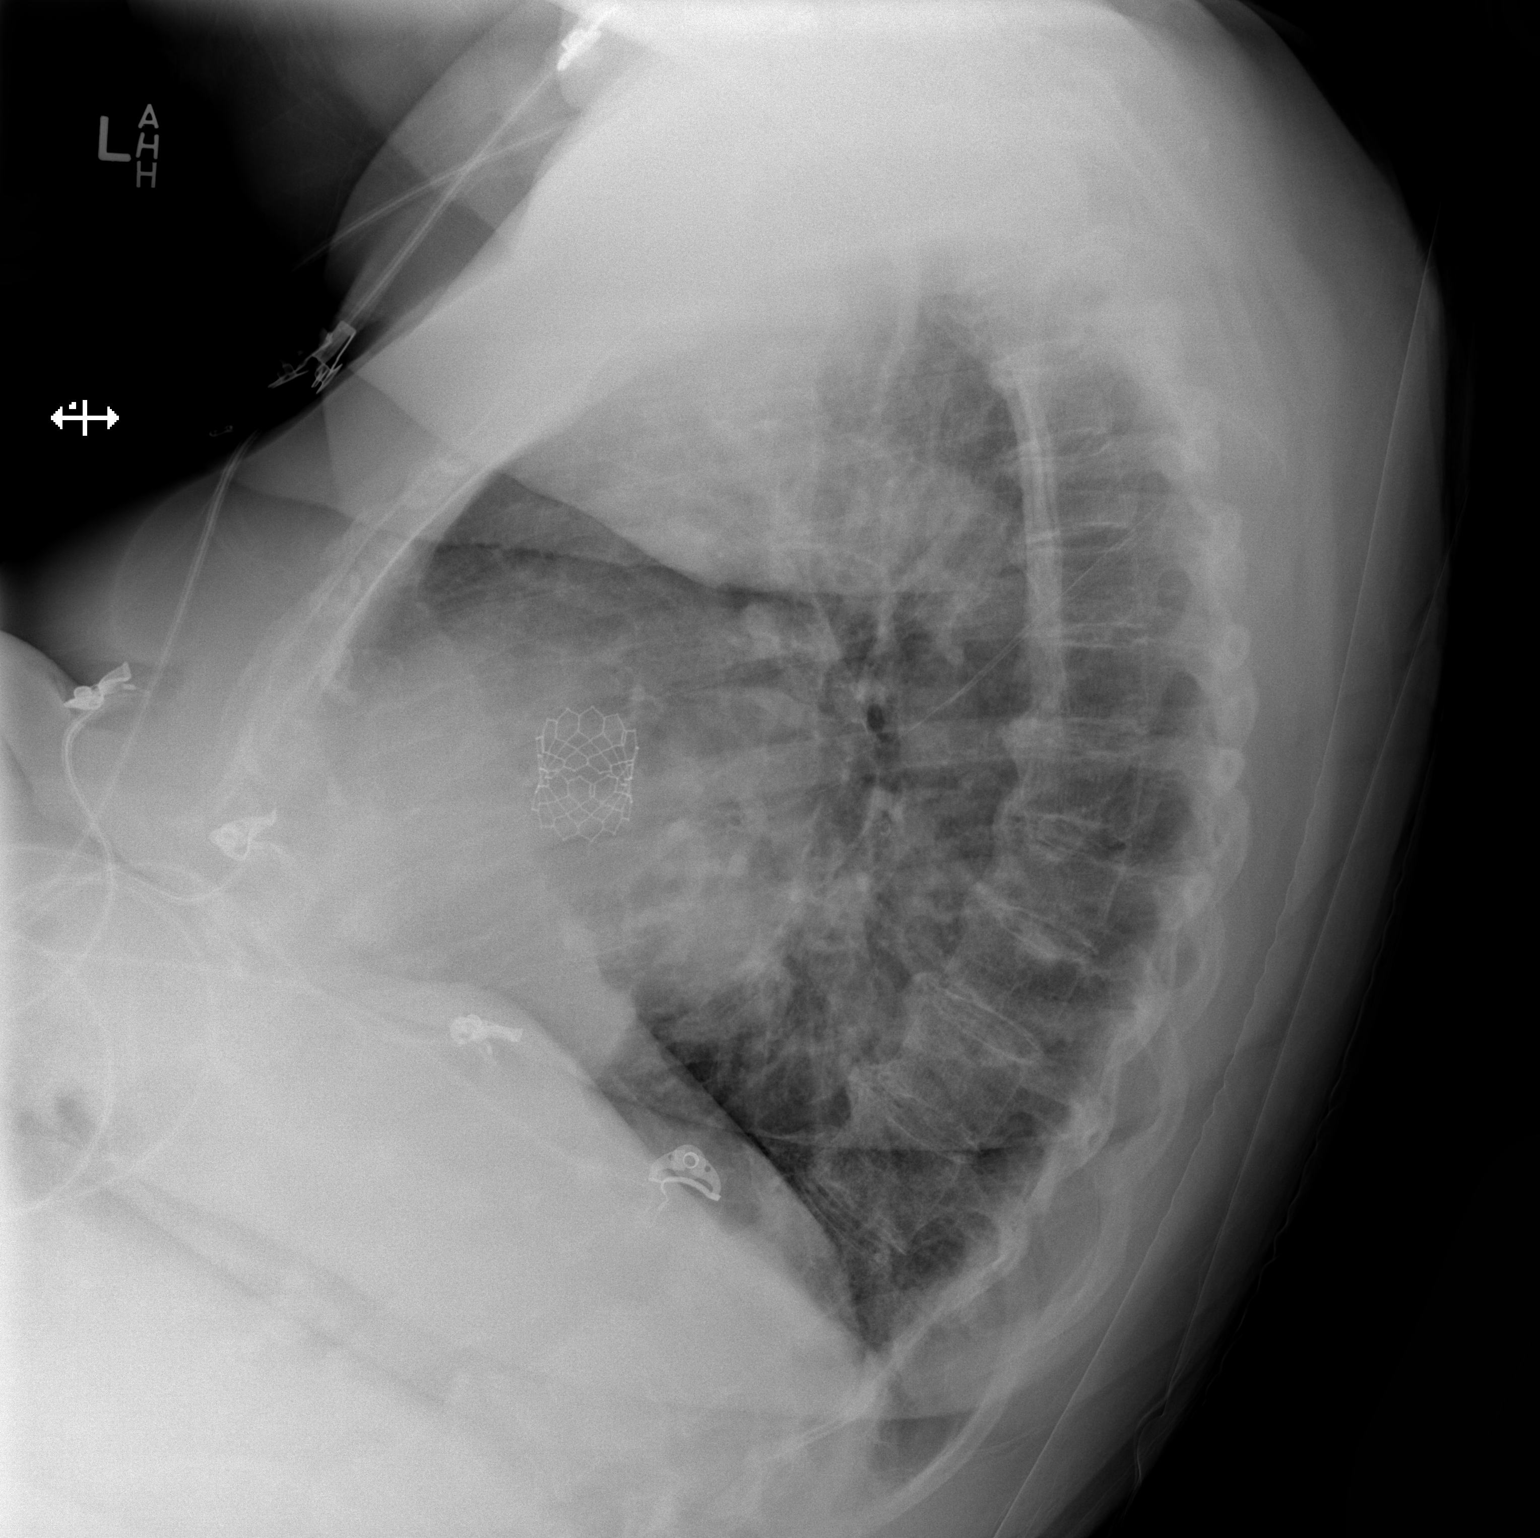

[x chest ap]
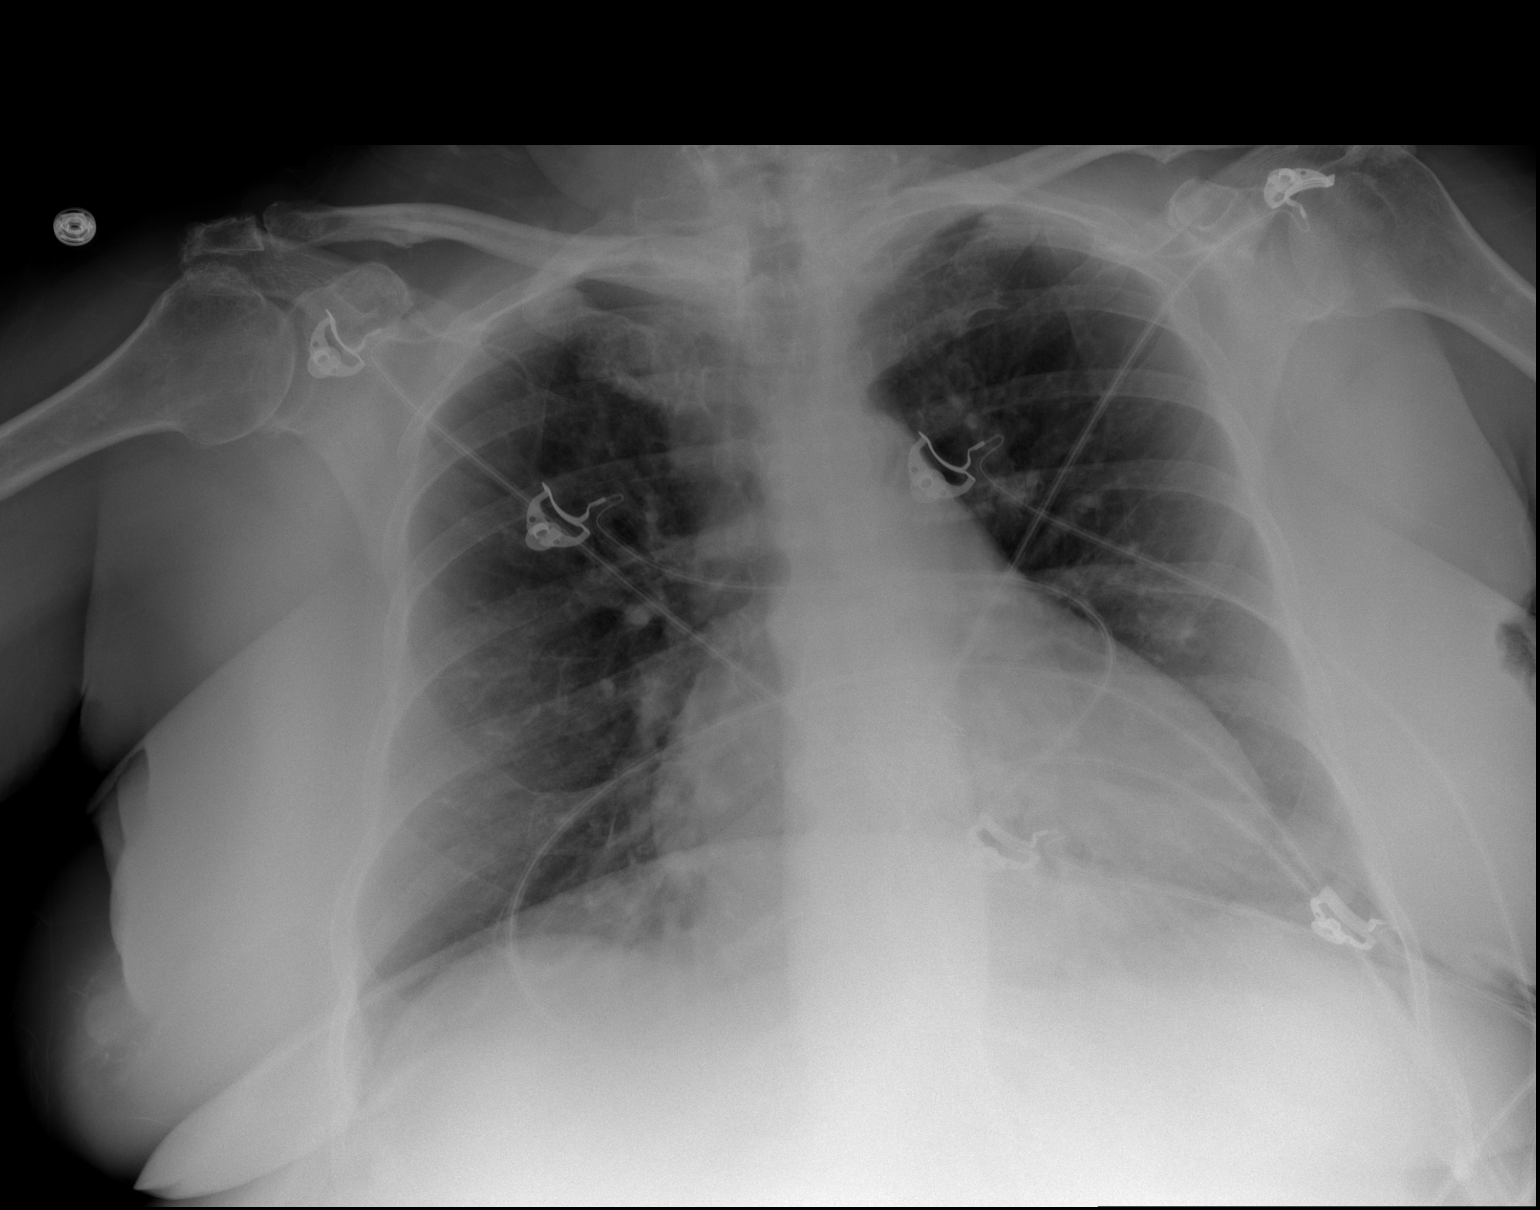

[2 of 2 positions shown; findings below may reference images not displayed]

FINDINGS: Cardiomegaly is noted. No acute infiltrate or pulmonary edema. Mild
basilar atelectasis. There is aortic valve prosthesis. Osteopenia
and degenerative changes thoracic spine
IMPRESSION: No active cardiopulmonary disease.

## 2017-04-08 IMAGING — CR DG CHEST 2V
2 series · 2 of 2 positions shown · non-contrast
Comparison: Two-view chest x-ray 08/07/2015

CLINICAL DATA: Cough and fever.  Chest pain.

EXAM:
CHEST - 2 VIEW

[w chest pa]
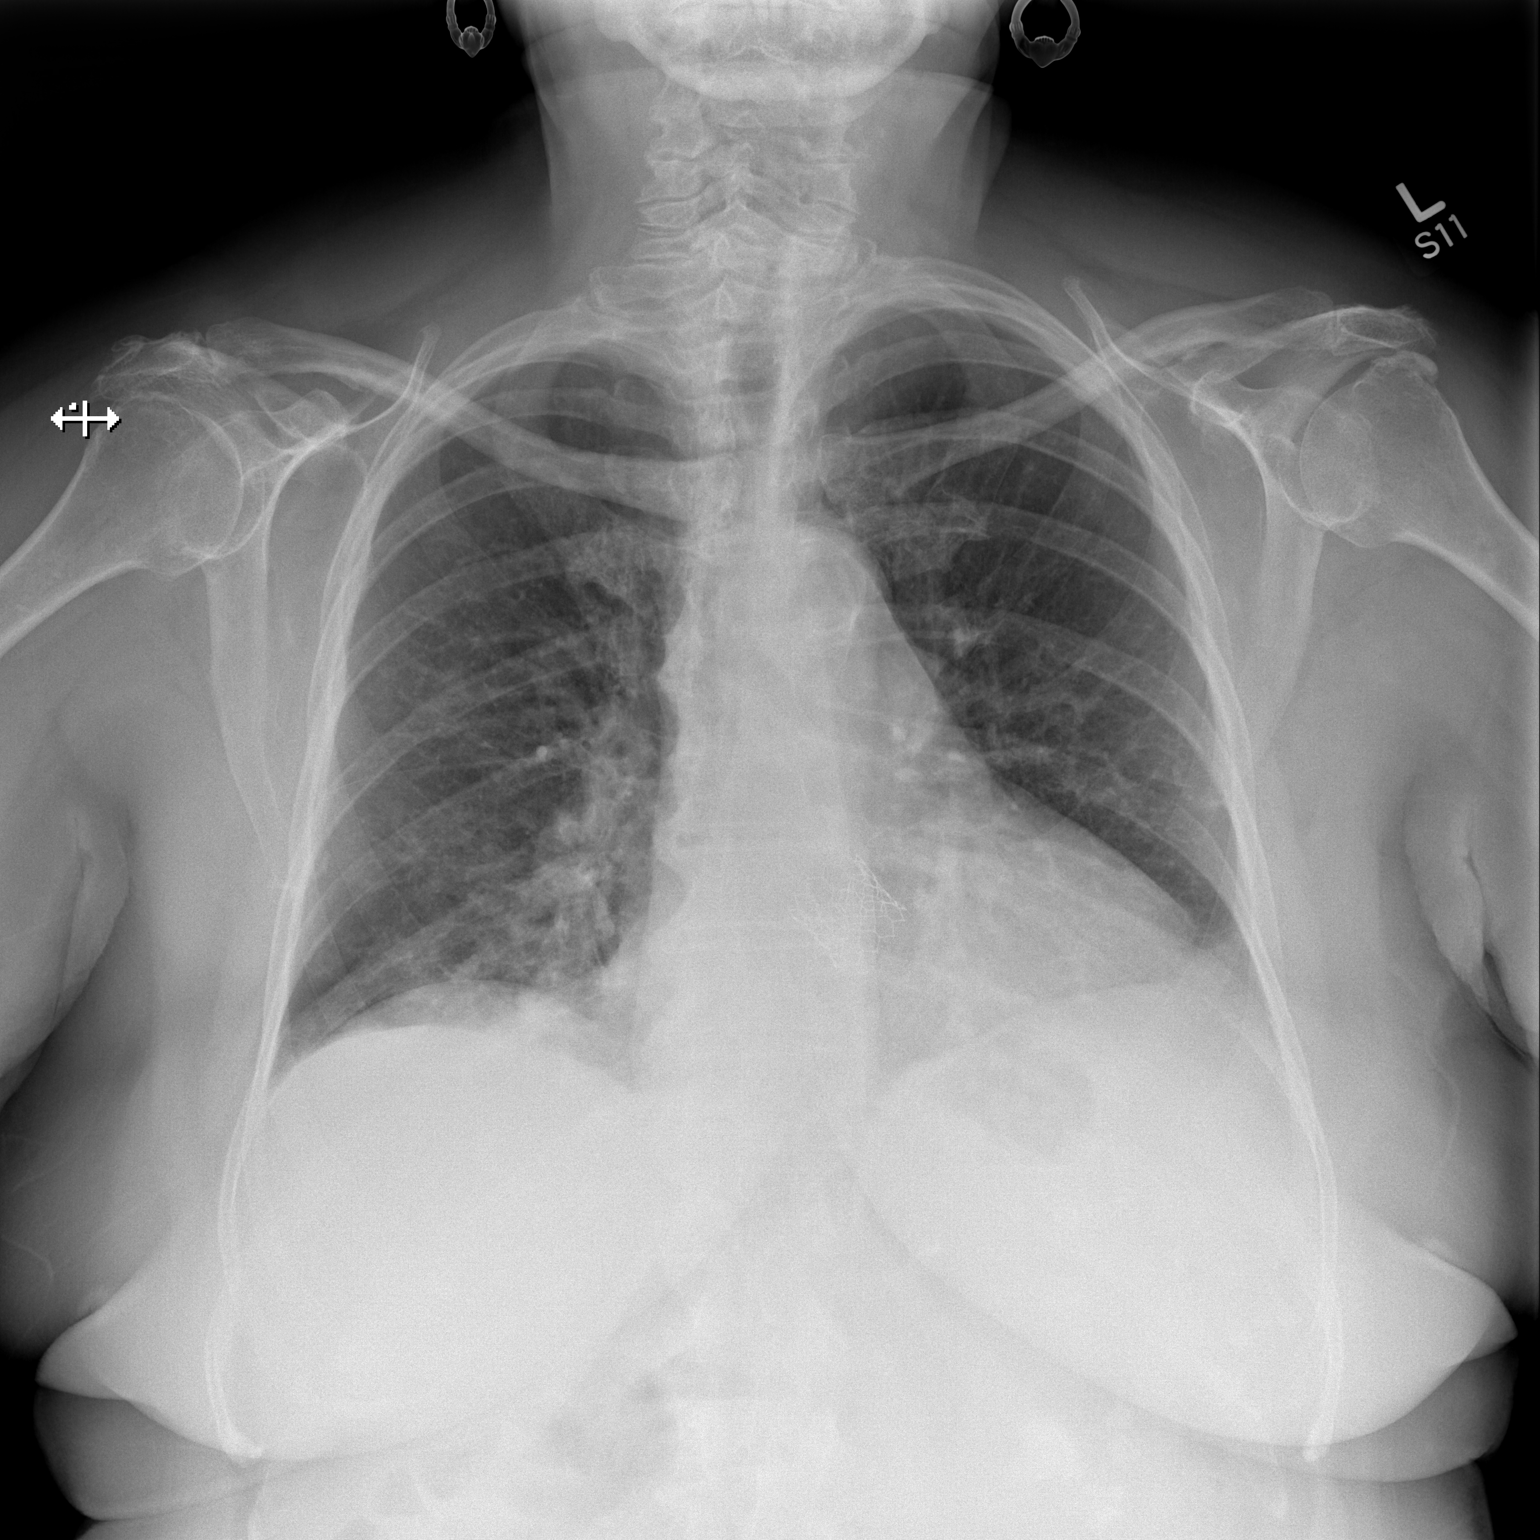

[w chest lat]
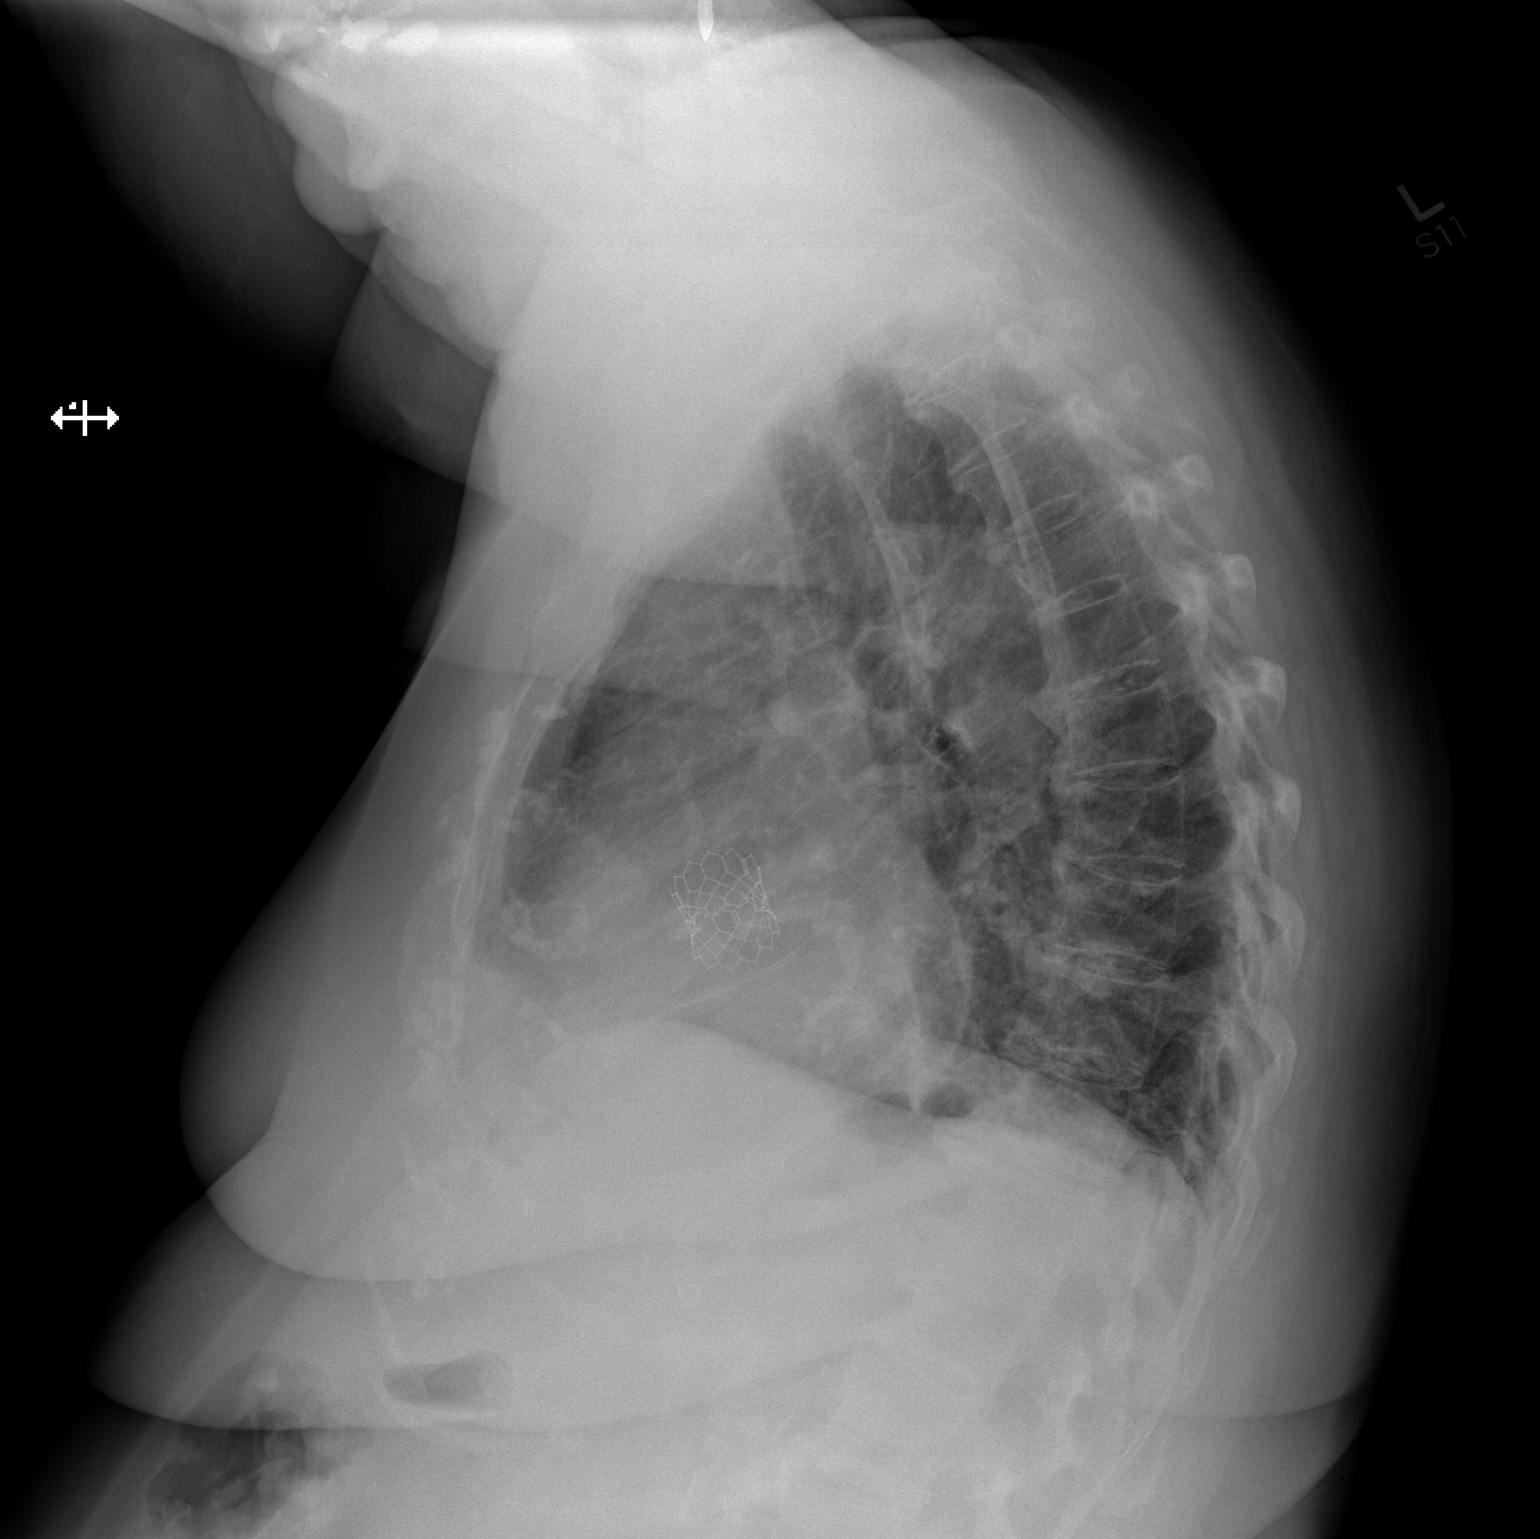

[2 of 2 positions shown; findings below may reference images not displayed]

FINDINGS: The heart is mildly enlarged. Is no edema or effusion to suggest
failure. Mild bibasilar airspace opacities are noted. The lung
volumes are low. An endovascular aortic valve is in place.
Degenerative changes and exaggerated kyphosis is present in the
thoracic spine. Degenerative changes are again noted at both
shoulders.
IMPRESSION: 1. Stable cardiomegaly without failure.
2. New mild bibasilar airspace opacities likely reflect atelectasis
in the setting of low lung volumes. Early infection is considered
less likely.
3. Atherosclerosis.

## 2019-12-27 DEATH — deceased
